# Patient Record
Sex: Male | Born: 1935 | Race: White | Hispanic: No | Marital: Married | Smoking: Never smoker
Health system: Southern US, Community
[De-identification: ages and names within clinical notes are randomized; demographics above are authoritative.]

## PROBLEM LIST (undated history)

## (undated) DIAGNOSIS — F0280 Dementia in other diseases classified elsewhere without behavioral disturbance: Secondary | ICD-10-CM

## (undated) DIAGNOSIS — K219 Gastro-esophageal reflux disease without esophagitis: Secondary | ICD-10-CM

## (undated) DIAGNOSIS — E785 Hyperlipidemia, unspecified: Secondary | ICD-10-CM

## (undated) DIAGNOSIS — L8994 Pressure ulcer of unspecified site, stage 4: Secondary | ICD-10-CM

## (undated) DIAGNOSIS — G309 Alzheimer's disease, unspecified: Secondary | ICD-10-CM

## (undated) DIAGNOSIS — F419 Anxiety disorder, unspecified: Secondary | ICD-10-CM

## (undated) DIAGNOSIS — R1310 Dysphagia, unspecified: Secondary | ICD-10-CM

## (undated) DIAGNOSIS — I469 Cardiac arrest, cause unspecified: Secondary | ICD-10-CM

## (undated) DIAGNOSIS — J9620 Acute and chronic respiratory failure, unspecified whether with hypoxia or hypercapnia: Secondary | ICD-10-CM

## (undated) DIAGNOSIS — N390 Urinary tract infection, site not specified: Secondary | ICD-10-CM

## (undated) DIAGNOSIS — E079 Disorder of thyroid, unspecified: Secondary | ICD-10-CM

## (undated) DIAGNOSIS — I10 Essential (primary) hypertension: Secondary | ICD-10-CM

## (undated) DIAGNOSIS — I4891 Unspecified atrial fibrillation: Secondary | ICD-10-CM

## (undated) HISTORY — PX: TRACHEOSTOMY TUBE PLACEMENT: SHX814

## (undated) HISTORY — PX: BACK SURGERY: SHX140

## (undated) HISTORY — PX: SHOULDER SURGERY: SHX246

## (undated) HISTORY — PX: GASTROSTOMY TUBE PLACEMENT: SHX655

## (undated) HISTORY — PX: KIDNEY STONE SURGERY: SHX686

---

## 2014-07-26 ENCOUNTER — Inpatient Hospital Stay (HOSPITAL_COMMUNITY)
Admission: EM | Admit: 2014-07-26 | Discharge: 2014-08-08 | DRG: 871 | Disposition: A | Discharge status: Skilled Nursing Facility | Payer: Medicare Other | Attending: Internal Medicine | Admitting: Internal Medicine

## 2014-07-26 ENCOUNTER — Encounter (HOSPITAL_COMMUNITY): Payer: Self-pay | Admitting: Vascular Surgery

## 2014-07-26 ENCOUNTER — Emergency Department (HOSPITAL_COMMUNITY): Payer: Medicare Other

## 2014-07-26 DIAGNOSIS — R739 Hyperglycemia, unspecified: Secondary | ICD-10-CM | POA: Diagnosis present

## 2014-07-26 DIAGNOSIS — I9589 Other hypotension: Secondary | ICD-10-CM | POA: Diagnosis present

## 2014-07-26 DIAGNOSIS — N189 Chronic kidney disease, unspecified: Secondary | ICD-10-CM

## 2014-07-26 DIAGNOSIS — Z931 Gastrostomy status: Secondary | ICD-10-CM

## 2014-07-26 DIAGNOSIS — F0390 Unspecified dementia without behavioral disturbance: Secondary | ICD-10-CM | POA: Diagnosis not present

## 2014-07-26 DIAGNOSIS — F0391 Unspecified dementia with behavioral disturbance: Secondary | ICD-10-CM

## 2014-07-26 DIAGNOSIS — J189 Pneumonia, unspecified organism: Secondary | ICD-10-CM | POA: Diagnosis present

## 2014-07-26 DIAGNOSIS — F0280 Dementia in other diseases classified elsewhere without behavioral disturbance: Secondary | ICD-10-CM | POA: Diagnosis present

## 2014-07-26 DIAGNOSIS — N179 Acute kidney failure, unspecified: Secondary | ICD-10-CM | POA: Diagnosis not present

## 2014-07-26 DIAGNOSIS — I429 Cardiomyopathy, unspecified: Secondary | ICD-10-CM | POA: Diagnosis not present

## 2014-07-26 DIAGNOSIS — I959 Hypotension, unspecified: Secondary | ICD-10-CM

## 2014-07-26 DIAGNOSIS — L89623 Pressure ulcer of left heel, stage 3: Secondary | ICD-10-CM | POA: Diagnosis not present

## 2014-07-26 DIAGNOSIS — Z9911 Dependence on respirator [ventilator] status: Secondary | ICD-10-CM

## 2014-07-26 DIAGNOSIS — D62 Acute posthemorrhagic anemia: Secondary | ICD-10-CM | POA: Diagnosis present

## 2014-07-26 DIAGNOSIS — E861 Hypovolemia: Secondary | ICD-10-CM | POA: Diagnosis not present

## 2014-07-26 DIAGNOSIS — G309 Alzheimer's disease, unspecified: Secondary | ICD-10-CM | POA: Diagnosis present

## 2014-07-26 DIAGNOSIS — E039 Hypothyroidism, unspecified: Secondary | ICD-10-CM | POA: Diagnosis not present

## 2014-07-26 DIAGNOSIS — D6489 Other specified anemias: Secondary | ICD-10-CM | POA: Diagnosis not present

## 2014-07-26 DIAGNOSIS — R64 Cachexia: Secondary | ICD-10-CM | POA: Diagnosis not present

## 2014-07-26 DIAGNOSIS — L89154 Pressure ulcer of sacral region, stage 4: Secondary | ICD-10-CM | POA: Diagnosis not present

## 2014-07-26 DIAGNOSIS — E870 Hyperosmolality and hypernatremia: Secondary | ICD-10-CM | POA: Diagnosis present

## 2014-07-26 DIAGNOSIS — R579 Shock, unspecified: Secondary | ICD-10-CM

## 2014-07-26 DIAGNOSIS — Y95 Nosocomial condition: Secondary | ICD-10-CM | POA: Diagnosis present

## 2014-07-26 DIAGNOSIS — R6520 Severe sepsis without septic shock: Secondary | ICD-10-CM | POA: Diagnosis not present

## 2014-07-26 DIAGNOSIS — Z515 Encounter for palliative care: Secondary | ICD-10-CM | POA: Diagnosis present

## 2014-07-26 DIAGNOSIS — Z8674 Personal history of sudden cardiac arrest: Secondary | ICD-10-CM | POA: Diagnosis not present

## 2014-07-26 DIAGNOSIS — K219 Gastro-esophageal reflux disease without esophagitis: Secondary | ICD-10-CM | POA: Diagnosis not present

## 2014-07-26 DIAGNOSIS — Z6820 Body mass index (BMI) 20.0-20.9, adult: Secondary | ICD-10-CM | POA: Diagnosis not present

## 2014-07-26 DIAGNOSIS — R6521 Severe sepsis with septic shock: Secondary | ICD-10-CM | POA: Diagnosis not present

## 2014-07-26 DIAGNOSIS — I480 Paroxysmal atrial fibrillation: Secondary | ICD-10-CM | POA: Diagnosis present

## 2014-07-26 DIAGNOSIS — Z930 Tracheostomy status: Secondary | ICD-10-CM | POA: Diagnosis not present

## 2014-07-26 DIAGNOSIS — D638 Anemia in other chronic diseases classified elsewhere: Secondary | ICD-10-CM | POA: Diagnosis not present

## 2014-07-26 DIAGNOSIS — J9610 Chronic respiratory failure, unspecified whether with hypoxia or hypercapnia: Secondary | ICD-10-CM | POA: Diagnosis present

## 2014-07-26 DIAGNOSIS — L8990 Pressure ulcer of unspecified site, unspecified stage: Secondary | ICD-10-CM | POA: Diagnosis present

## 2014-07-26 DIAGNOSIS — N390 Urinary tract infection, site not specified: Secondary | ICD-10-CM | POA: Diagnosis present

## 2014-07-26 DIAGNOSIS — E43 Unspecified severe protein-calorie malnutrition: Secondary | ICD-10-CM | POA: Diagnosis present

## 2014-07-26 DIAGNOSIS — D649 Anemia, unspecified: Secondary | ICD-10-CM | POA: Diagnosis present

## 2014-07-26 DIAGNOSIS — I129 Hypertensive chronic kidney disease with stage 1 through stage 4 chronic kidney disease, or unspecified chronic kidney disease: Secondary | ICD-10-CM | POA: Diagnosis not present

## 2014-07-26 DIAGNOSIS — B964 Proteus (mirabilis) (morganii) as the cause of diseases classified elsewhere: Secondary | ICD-10-CM | POA: Diagnosis present

## 2014-07-26 DIAGNOSIS — E038 Other specified hypothyroidism: Secondary | ICD-10-CM | POA: Diagnosis present

## 2014-07-26 DIAGNOSIS — I469 Cardiac arrest, cause unspecified: Secondary | ICD-10-CM | POA: Diagnosis present

## 2014-07-26 DIAGNOSIS — N10 Acute tubulo-interstitial nephritis: Secondary | ICD-10-CM | POA: Diagnosis not present

## 2014-07-26 DIAGNOSIS — E876 Hypokalemia: Secondary | ICD-10-CM | POA: Diagnosis not present

## 2014-07-26 DIAGNOSIS — F03C0 Unspecified dementia, severe, without behavioral disturbance, psychotic disturbance, mood disturbance, and anxiety: Secondary | ICD-10-CM | POA: Diagnosis present

## 2014-07-26 DIAGNOSIS — K5900 Constipation, unspecified: Secondary | ICD-10-CM | POA: Diagnosis present

## 2014-07-26 DIAGNOSIS — IMO0001 Reserved for inherently not codable concepts without codable children: Secondary | ICD-10-CM | POA: Diagnosis present

## 2014-07-26 DIAGNOSIS — A419 Sepsis, unspecified organism: Principal | ICD-10-CM

## 2014-07-26 DIAGNOSIS — K5909 Other constipation: Secondary | ICD-10-CM | POA: Diagnosis present

## 2014-07-26 HISTORY — DX: Cardiac arrest, cause unspecified: I46.9

## 2014-07-26 HISTORY — DX: Disorder of thyroid, unspecified: E07.9

## 2014-07-26 HISTORY — DX: Gastro-esophageal reflux disease without esophagitis: K21.9

## 2014-07-26 HISTORY — DX: Acute and chronic respiratory failure, unspecified whether with hypoxia or hypercapnia: J96.20

## 2014-07-26 HISTORY — DX: Urinary tract infection, site not specified: N39.0

## 2014-07-26 HISTORY — DX: Pressure ulcer of unspecified site, stage 4: L89.94

## 2014-07-26 HISTORY — DX: Dementia in other diseases classified elsewhere, unspecified severity, without behavioral disturbance, psychotic disturbance, mood disturbance, and anxiety: F02.80

## 2014-07-26 HISTORY — DX: Alzheimer's disease, unspecified: G30.9

## 2014-07-26 HISTORY — DX: Hyperlipidemia, unspecified: E78.5

## 2014-07-26 HISTORY — DX: Anxiety disorder, unspecified: F41.9

## 2014-07-26 HISTORY — DX: Unspecified atrial fibrillation: I48.91

## 2014-07-26 HISTORY — DX: Dysphagia, unspecified: R13.10

## 2014-07-26 HISTORY — DX: Essential (primary) hypertension: I10

## 2014-07-26 LAB — URINALYSIS, ROUTINE W REFLEX MICROSCOPIC
Bilirubin Urine: NEGATIVE
Glucose, UA: 500 mg/dL — AB
Ketones, ur: NEGATIVE mg/dL
NITRITE: NEGATIVE
PH: 8.5 — AB (ref 5.0–8.0)
Protein, ur: 100 mg/dL — AB
SPECIFIC GRAVITY, URINE: 1.02 (ref 1.005–1.030)
Urobilinogen, UA: 0.2 mg/dL (ref 0.0–1.0)

## 2014-07-26 LAB — CBC WITH DIFFERENTIAL/PLATELET
Basophils Absolute: 0 10*3/uL (ref 0.0–0.1)
Basophils Relative: 0 % (ref 0–1)
EOS ABS: 0.1 10*3/uL (ref 0.0–0.7)
Eosinophils Relative: 2 % (ref 0–5)
HCT: 16.9 % — ABNORMAL LOW (ref 39.0–52.0)
Hemoglobin: 4.8 g/dL — CL (ref 13.0–17.0)
Lymphocytes Relative: 14 % (ref 12–46)
Lymphs Abs: 0.9 10*3/uL (ref 0.7–4.0)
MCH: 31.2 pg (ref 26.0–34.0)
MCHC: 28.4 g/dL — AB (ref 30.0–36.0)
MCV: 109.7 fL — ABNORMAL HIGH (ref 78.0–100.0)
MONO ABS: 0.2 10*3/uL (ref 0.1–1.0)
Monocytes Relative: 3 % (ref 3–12)
NEUTROS PCT: 81 % — AB (ref 43–77)
Neutro Abs: 4.9 10*3/uL (ref 1.7–7.7)
Platelets: 56 10*3/uL — ABNORMAL LOW (ref 150–400)
RBC: 1.54 MIL/uL — ABNORMAL LOW (ref 4.22–5.81)
RDW: 21.4 % — AB (ref 11.5–15.5)
WBC MORPHOLOGY: INCREASED
WBC: 6.1 10*3/uL (ref 4.0–10.5)

## 2014-07-26 LAB — I-STAT ARTERIAL BLOOD GAS, ED
Acid-Base Excess: 1 mmol/L (ref 0.0–2.0)
Bicarbonate: 25.6 mEq/L — ABNORMAL HIGH (ref 20.0–24.0)
O2 Saturation: 52 %
PCO2 ART: 42.2 mmHg (ref 35.0–45.0)
PH ART: 7.391 (ref 7.350–7.450)
Patient temperature: 98.6
TCO2: 27 mmol/L (ref 0–100)
pO2, Arterial: 28 mmHg — CL (ref 80.0–100.0)

## 2014-07-26 LAB — COMPREHENSIVE METABOLIC PANEL
ALBUMIN: 1 g/dL — AB (ref 3.5–5.0)
ALT: 12 U/L — ABNORMAL LOW (ref 17–63)
AST: 13 U/L — AB (ref 15–41)
Alkaline Phosphatase: 129 U/L — ABNORMAL HIGH (ref 38–126)
Anion gap: 9 (ref 5–15)
BILIRUBIN TOTAL: 0.4 mg/dL (ref 0.3–1.2)
BUN: 130 mg/dL — AB (ref 6–20)
CALCIUM: 11.4 mg/dL — AB (ref 8.9–10.3)
CO2: 25 mmol/L (ref 22–32)
CREATININE: 1.8 mg/dL — AB (ref 0.61–1.24)
Chloride: 122 mmol/L — ABNORMAL HIGH (ref 101–111)
GFR calc Af Amer: 39 mL/min — ABNORMAL LOW (ref >60)
GFR calc non Af Amer: 34 mL/min — ABNORMAL LOW (ref >60)
Glucose, Bld: 428 mg/dL — ABNORMAL HIGH (ref 65–99)
Potassium: 5.1 mmol/L (ref 3.5–5.1)
Sodium: 156 mmol/L — ABNORMAL HIGH (ref 135–145)
Total Protein: 6.5 g/dL (ref 6.5–8.1)

## 2014-07-26 LAB — URINE MICROSCOPIC-ADD ON

## 2014-07-26 LAB — I-STAT CG4 LACTIC ACID, ED
LACTIC ACID, VENOUS: 4.5 mmol/L — AB (ref 0.5–2.0)
Lactic Acid, Venous: 3.99 mmol/L (ref 0.5–2.0)

## 2014-07-26 LAB — GLUCOSE, CAPILLARY: GLUCOSE-CAPILLARY: 218 mg/dL — AB (ref 65–99)

## 2014-07-26 LAB — PREPARE RBC (CROSSMATCH)

## 2014-07-26 LAB — ABO/RH: ABO/RH(D): O NEG

## 2014-07-26 LAB — CBG MONITORING, ED: Glucose-Capillary: 266 mg/dL — ABNORMAL HIGH (ref 65–99)

## 2014-07-26 LAB — MRSA PCR SCREENING: MRSA by PCR: NEGATIVE

## 2014-07-26 MED ORDER — LEVOTHYROXINE SODIUM 75 MCG PO TABS
75.0000 ug | ORAL_TABLET | Freq: Every day | ORAL | Status: DC
  Administered 2014-07-27 – 2014-08-08 (×13): 75 ug
  Filled 2014-07-26 (×15): qty 1

## 2014-07-26 MED ORDER — DEXTROSE 5 % IV SOLN
INTRAVENOUS | Status: DC
  Administered 2014-07-27: via INTRAVENOUS

## 2014-07-26 MED ORDER — FREE WATER
200.0000 mL | Status: DC
  Administered 2014-07-26 – 2014-08-02 (×41): 200 mL

## 2014-07-26 MED ORDER — CHLORHEXIDINE GLUCONATE 0.12 % MT SOLN
15.0000 mL | Freq: Two times a day (BID) | OROMUCOSAL | Status: DC
  Administered 2014-07-26 – 2014-08-08 (×26): 15 mL via OROMUCOSAL
  Filled 2014-07-26 (×28): qty 15

## 2014-07-26 MED ORDER — ONDANSETRON HCL 4 MG/2ML IJ SOLN: 4.0000 mg | Freq: Four times a day (QID) | INTRAMUSCULAR | Status: DC | PRN

## 2014-07-26 MED ORDER — VANCOMYCIN HCL IN DEXTROSE 1-5 GM/200ML-% IV SOLN: 1000.0000 mg | Freq: Once | INTRAVENOUS | Status: DC

## 2014-07-26 MED ORDER — VALPROIC ACID 250 MG/5ML PO SYRP
500.0000 mg | ORAL_SOLUTION | Freq: Three times a day (TID) | ORAL | Status: DC
  Administered 2014-07-26 – 2014-08-08 (×38): 500 mg
  Filled 2014-07-26 (×41): qty 10

## 2014-07-26 MED ORDER — VANCOMYCIN HCL 10 G IV SOLR
1250.0000 mg | Freq: Once | INTRAVENOUS | Status: AC
  Administered 2014-07-26: 1250 mg via INTRAVENOUS
  Filled 2014-07-26: qty 1250

## 2014-07-26 MED ORDER — CETYLPYRIDINIUM CHLORIDE 0.05 % MT LIQD
7.0000 mL | Freq: Four times a day (QID) | OROMUCOSAL | Status: DC
  Administered 2014-07-27 – 2014-08-08 (×50): 7 mL via OROMUCOSAL

## 2014-07-26 MED ORDER — VITAL HIGH PROTEIN PO LIQD
1000.0000 mL | ORAL | Status: DC
  Administered 2014-07-27: 1000 mL
  Filled 2014-07-26 (×2): qty 1000

## 2014-07-26 MED ORDER — FOLIC ACID 1 MG PO TABS
1.0000 mg | ORAL_TABLET | Freq: Every day | ORAL | Status: DC
  Administered 2014-07-27 – 2014-08-08 (×13): 1 mg
  Filled 2014-07-26 (×13): qty 1

## 2014-07-26 MED ORDER — SODIUM CHLORIDE 0.9 % IV BOLUS (SEPSIS)
500.0000 mL | INTRAVENOUS | Status: AC
  Administered 2014-07-26: 500 mL via INTRAVENOUS

## 2014-07-26 MED ORDER — AMIODARONE HCL 200 MG PO TABS
200.0000 mg | ORAL_TABLET | Freq: Every day | ORAL | Status: DC
  Administered 2014-07-27 – 2014-08-08 (×13): 200 mg
  Filled 2014-07-26 (×13): qty 1

## 2014-07-26 MED ORDER — PANTOPRAZOLE SODIUM 40 MG IV SOLR: 40.0000 mg | Freq: Every day | INTRAVENOUS | Status: DC

## 2014-07-26 MED ORDER — DEXTROSE 5 % IV SOLN
1.0000 g | Freq: Three times a day (TID) | INTRAVENOUS | Status: AC
  Administered 2014-07-27 – 2014-08-04 (×26): 1 g via INTRAVENOUS
  Filled 2014-07-26 (×29): qty 1

## 2014-07-26 MED ORDER — PANTOPRAZOLE SODIUM 40 MG PO PACK
40.0000 mg | PACK | Freq: Every day | ORAL | Status: DC
  Administered 2014-07-27 – 2014-08-07 (×12): 40 mg
  Filled 2014-07-26 (×13): qty 20

## 2014-07-26 MED ORDER — SODIUM CHLORIDE 0.9 % IV SOLN: 250.0000 mL | INTRAVENOUS | Status: DC | PRN

## 2014-07-26 MED ORDER — SODIUM CHLORIDE 0.9 % IV BOLUS (SEPSIS)
1000.0000 mL | INTRAVENOUS | Status: AC
  Administered 2014-07-26 (×2): 1000 mL via INTRAVENOUS

## 2014-07-26 MED ORDER — LEVOFLOXACIN IN D5W 750 MG/150ML IV SOLN
750.0000 mg | Freq: Once | INTRAVENOUS | Status: AC
  Administered 2014-07-26: 750 mg via INTRAVENOUS
  Filled 2014-07-26: qty 150

## 2014-07-26 MED ORDER — INSULIN GLARGINE 100 UNIT/ML ~~LOC~~ SOLN
15.0000 [IU] | Freq: Every day | SUBCUTANEOUS | Status: DC
  Administered 2014-07-26: 15 [IU] via SUBCUTANEOUS
  Filled 2014-07-26 (×3): qty 0.15

## 2014-07-26 MED ORDER — INSULIN ASPART 100 UNIT/ML ~~LOC~~ SOLN
10.0000 [IU] | Freq: Once | SUBCUTANEOUS | Status: AC
  Administered 2014-07-26: 10 [IU] via INTRAVENOUS
  Filled 2014-07-26: qty 1

## 2014-07-26 MED ORDER — SODIUM CHLORIDE 0.9 % IV SOLN
10.0000 mL/h | Freq: Once | INTRAVENOUS | Status: AC
  Administered 2014-07-26: 10 mL/h via INTRAVENOUS

## 2014-07-26 MED ORDER — FENTANYL CITRATE (PF) 100 MCG/2ML IJ SOLN
25.0000 ug | INTRAMUSCULAR | Status: DC | PRN
  Administered 2014-07-27 – 2014-07-29 (×2): 50 ug via INTRAVENOUS
  Filled 2014-07-26 (×2): qty 2

## 2014-07-26 MED ORDER — LEVOFLOXACIN IN D5W 750 MG/150ML IV SOLN
750.0000 mg | INTRAVENOUS | Status: DC
  Filled 2014-07-26: qty 150

## 2014-07-26 MED ORDER — ALTEPLASE 2 MG IJ SOLR
2.0000 mg | Freq: Once | INTRAMUSCULAR | Status: AC
  Administered 2014-07-26: 2 mg
  Filled 2014-07-26: qty 2

## 2014-07-26 MED ORDER — VANCOMYCIN HCL IN DEXTROSE 1-5 GM/200ML-% IV SOLN
1000.0000 mg | INTRAVENOUS | Status: DC
  Administered 2014-07-27: 1000 mg via INTRAVENOUS
  Filled 2014-07-26 (×2): qty 200

## 2014-07-26 MED ORDER — DEXTROSE 5 % IV SOLN
2.0000 g | Freq: Once | INTRAVENOUS | Status: AC
  Administered 2014-07-26: 2 g via INTRAVENOUS
  Filled 2014-07-26: qty 2

## 2014-07-26 MED ORDER — ACETAMINOPHEN 325 MG PO TABS: 650.0000 mg | ORAL_TABLET | ORAL | Status: DC | PRN

## 2014-07-26 MED ORDER — INSULIN ASPART 100 UNIT/ML ~~LOC~~ SOLN
0.0000 [IU] | SUBCUTANEOUS | Status: DC
  Administered 2014-07-26: 5 [IU] via SUBCUTANEOUS
  Administered 2014-07-26 – 2014-07-27 (×3): 3 [IU] via SUBCUTANEOUS
  Administered 2014-07-27: 5 [IU] via SUBCUTANEOUS
  Administered 2014-07-27: 2 [IU] via SUBCUTANEOUS
  Administered 2014-07-27: 11 [IU] via SUBCUTANEOUS
  Administered 2014-07-28: 3 [IU] via SUBCUTANEOUS
  Administered 2014-07-28 (×3): 5 [IU] via SUBCUTANEOUS
  Administered 2014-07-28: 8 [IU] via SUBCUTANEOUS
  Administered 2014-07-29 (×2): 3 [IU] via SUBCUTANEOUS
  Administered 2014-07-29: 5 [IU] via SUBCUTANEOUS
  Administered 2014-07-29: 2 [IU] via SUBCUTANEOUS
  Administered 2014-07-29: 5 [IU] via SUBCUTANEOUS
  Administered 2014-07-29 – 2014-07-31 (×9): 3 [IU] via SUBCUTANEOUS
  Administered 2014-07-31: 2 [IU] via SUBCUTANEOUS
  Administered 2014-08-01 – 2014-08-03 (×6): 0 [IU] via SUBCUTANEOUS

## 2014-07-26 NOTE — ED Notes (Signed)
X-ray at bedside

## 2014-07-26 NOTE — Progress Notes (Addendum)
ANTIBIOTIC CONSULT NOTE - INITIAL  Pharmacy Consult for vanc/levo/aztreonam Indication: rule out sepsis  Allergies  Allergen Reactions  . Other     "Benzathine"  . Rocephin [Ceftriaxone Sodium In Dextrose]     Patient Measurements: Height: 6\' 1"  (185.4 cm) IBW/kg (Calculated) : 79.9  Vital Signs: BP: 82/50 mmHg (06/22 1641) Pulse Rate: 77 (06/22 1641) Intake/Output from previous day:   Intake/Output from this shift:    Labs: No results for input(s): WBC, HGB, PLT, LABCREA, CREATININE in the last 72 hours. CrCl cannot be calculated (Unknown ideal weight.). No results for input(s): VANCOTROUGH, VANCOPEAK, VANCORANDOM, GENTTROUGH, GENTPEAK, GENTRANDOM, TOBRATROUGH, TOBRAPEAK, TOBRARND, AMIKACINPEAK, AMIKACINTROU, AMIKACIN in the last 72 hours.   Microbiology: No results found for this or any previous visit (from the past 720 hour(s)).  Medical History: Past Medical History  Diagnosis Date  . Alzheimer disease   . Anxiety   . Acute and chronic respiratory failure (acute-on-chronic)   . Cardiac arrest   . Urinary tract infection   . Hypertension   . Hyperlipidemia   . Atrial fibrillation   . Dysphagia   . GERD (gastroesophageal reflux disease)   . Thyroid disease   . Stage 4 pressure ulcer sacrum   Assessment: 83 yom chronic trach-vent dependent fro [facility]. Received several units of blood on admit. Pharmacy consulted to dose vanc/aztreonam/levo for sepsis.CrCl 31.4 ml/Min, lactate 4.5, AF  6/22 vanc>> 6/22 levo>> 6/22 aztreonam  Goal of Therapy:  Vancomycin trough level 15-20 mcg/ml  Plan:  Vanc 1250mg  IV x1, then 1gm q24h Aztreonam 2g x1, then 1 g q8h Levo 750mg  q48h Mon clinical progress, c/s, renal function, abx plan  F/u labs - abx maintenance doses  F/u need to double cover going forward?  Isaac Bliss, PharmD, BCPS Clinical Pharmacist Pager [phone] 07/26/2014 8:32 PM

## 2014-07-26 NOTE — ED Notes (Signed)
Pt reports to the ED for eval of low Hb. He had his blood drawn and his HB was noted to be 4.8 mg/dl. His other labs were also noted to be abnormal of note his Na+ was 152 mg/dl, platelets were 47, CBG 418 mg/dl, BUN - 161, Ca+ 09.6, and a Mag of 3.1. Pt received his first unit of blood PTA and tolerated it well. He is a trach pt. Also reported is possible VAP with rhonchi noted by MD at facility. PICC line present to right upper arm. Stage 4 pressure ulcer reported on sacral area. Pt alert and non-verbal at baseline. Resp elevated. Skin warm and dry. BP low at 81/36. Pt full code.

## 2014-07-26 NOTE — ED Notes (Signed)
NOTIFIED DR. S.ZACKOWSKI FOR PATIENTS LAB RESULTS OF CG4+LACTIC ACID @17 :08PM ,07/26/2014.

## 2014-07-26 NOTE — ED Provider Notes (Addendum)
CSN: [id number]     Arrival date & time 07/26/14  1523 History   First MD Initiated Contact with Patient 07/26/14 1544     Chief Complaint  Patient presents with  . Abnormal Lab     (Consider location/radiation/quality/duration/timing/severity/associated sxs/prior Treatment) The history is provided by the nursing home and a relative. The history is limited by the condition of the patient.   81 year old male from [facility]. Patient is vent dependent also has severe Alzheimer's disease. Patient is a full code. Patient sent to the ED for eval of low hemoglobin. Patient had a hemoglobin therefore 0.8. Other labs showed abnormalities. Sodium was elevated. Blood sugar elevated also BUN elevated creatinine of 2 terribly elevated. Calcium was also elevated 11.5. According to nursing note patient received first unit of blood prior to arrival. Not sure if this is accurate. We'll try to verify with family. Level V caveat applies to the history and patient has severe Alzheimer's disease.  Patient has a triple-lumen in the left arm area. Foley catheter in place.  Past Medical History  Diagnosis Date  . Alzheimer disease   . Anxiety   . Acute and chronic respiratory failure (acute-on-chronic)   . Cardiac arrest   . Urinary tract infection   . Hypertension   . Hyperlipidemia   . Atrial fibrillation   . Dysphagia   . GERD (gastroesophageal reflux disease)   . Thyroid disease   . Stage 4 pressure ulcer sacrum   Past Surgical History  Procedure Laterality Date  . Gastrostomy tube placement    . Back surgery    . Kidney stone surgery    . Shoulder surgery    . Tracheostomy tube placement     No family history on file. History  Substance Use Topics  . Smoking status: Never Smoker   . Smokeless tobacco: Never Used  . Alcohol Use: No    Review of Systems  Unable to perform ROS level V caveat applies due to patient's severe Alzheimer disease. Patient also vent dependent.    Allergies   Other and Rocephin  Home Medications   Prior to Admission medications   Medication Sig Start Date End Date Taking? Authorizing Provider  acetaminophen (TYLENOL) 325 MG tablet 650 mg by PEG Tube route every 4 (four) hours as needed for mild pain or moderate pain.   Yes Historical Provider, MD  amiodarone (PACERONE) 200 MG tablet 200 mg by PEG Tube route daily.   Yes Historical Provider, MD  bacitracin ointment Apply 1 application topically 2 (two) times daily.   Yes Historical Provider, MD  chlorhexidine (PERIDEX) 0.12 % solution Use as directed 15 mLs in the mouth or throat 2 (two) times daily.   Yes Historical Provider, MD  cloNIDine (CATAPRES) 0.1 MG tablet 0.1 mg by PEG Tube route every 8 (eight) hours.   Yes Historical Provider, MD  dextrose 5 % solution Inject 75 mLs into the vein daily. 13mL/hr for hydration   Yes Historical Provider, MD  diltiazem (CARDIZEM) 60 MG tablet Take 120 mg by mouth every 8 (eight) hours.   Yes Historical Provider, MD  docusate (COLACE) 50 MG/5ML liquid Place 200 mg into feeding tube daily.   Yes Historical Provider, MD  HYDROcodone-acetaminophen (NORCO/VICODIN) 5-325 MG per tablet 1 tablet by PEG Tube route every 6 (six) hours as needed for moderate pain.   Yes Historical Provider, MD  ipratropium-albuterol (DUONEB) 0.5-2.5 (3) MG/3ML SOLN Take 3 mLs by nebulization every 6 (six) hours as needed.  Yes Historical Provider, MD  levothyroxine (SYNTHROID, LEVOTHROID) 75 MCG tablet 75 mcg by PEG Tube route daily before breakfast.   Yes Historical Provider, MD  LORazepam (ATIVAN) 2 MG/ML concentrated solution Take 2 mg by mouth every 8 (eight) hours as needed for anxiety (Shortness of Breath).   Yes Historical Provider, MD  omeprazole (PRILOSEC) 40 MG capsule 40 mg by PEG Tube route daily.   Yes Historical Provider, MD  ondansetron (ZOFRAN) 4 MG tablet 4 mg by PEG Tube route every 6 (six) hours as needed for nausea or vomiting.   Yes Historical Provider, MD   Pediatric Multiple Vitamins (MULTIVITAMIN) LIQD Place 5 mLs into feeding tube daily.   Yes Historical Provider, MD  sennosides-docusate sodium (SENOKOT-S) 8.6-50 MG tablet 1 tablet by PEG Tube route daily.   Yes Historical Provider, MD  Valproic Acid (DEPAKENE) 250 MG/5ML SYRP syrup Place 500 mg into feeding tube every 8 (eight) hours.   Yes Historical Provider, MD  ciprofloxacin (CIPRO) 400 MG/40ML SOLN injection Inject 400 mg into the vein every 12 (twelve) hours.    Historical Provider, MD   BP 93/41 mmHg  Pulse 72  Temp(Src) 98.4 F (36.9 C) (Rectal)  Resp 30  Ht 6\' 1"  (1.854 m)  Wt 152 lb (68.947 kg)  BMI 20.06 kg/m2  SpO2 100% Physical Exam  Constitutional: He appears well-developed and well-nourished.  HENT:  Head: Normocephalic and atraumatic.  Patient with trach and is vent dependent.  Neck: Neck supple.  Tracheostomy.  Cardiovascular: Normal rate and regular rhythm.   Pulmonary/Chest: He has rales.  Abdominal: Soft. Bowel sounds are normal. He exhibits no distension.  G-tube in place.  Musculoskeletal: He exhibits no tenderness.  Neurological:  Nonverbal.  Skin: Skin is warm.  Nursing note and vitals reviewed.   ED Course  Procedures (including critical care time) Labs Review Labs Reviewed  COMPREHENSIVE METABOLIC PANEL - Abnormal; Notable for the following:    Sodium 156 (*)    Chloride 122 (*)    Glucose, Bld 428 (*)    BUN 130 (*)    Creatinine, Ser 1.80 (*)    Calcium 11.4 (*)    Albumin 1.0 (*)    AST 13 (*)    ALT 12 (*)    Alkaline Phosphatase 129 (*)    GFR calc non Af Amer 34 (*)    GFR calc Af Amer 39 (*)    All other components within normal limits  CBC WITH DIFFERENTIAL/PLATELET - Abnormal; Notable for the following:    RBC 1.54 (*)    Hemoglobin 4.8 (*)    HCT 16.9 (*)    MCV 109.7 (*)    MCHC 28.4 (*)    RDW 21.4 (*)    Platelets 56 (*)    Neutrophils Relative % 81 (*)    All other components within normal limits  URINALYSIS,  ROUTINE W REFLEX MICROSCOPIC (NOT AT [facility]) - Abnormal; Notable for the following:    APPearance HAZY (*)    pH 8.5 (*)    Glucose, UA 500 (*)    Hgb urine dipstick LARGE (*)    Protein, ur 100 (*)    Leukocytes, UA LARGE (*)    All other components within normal limits  URINE MICROSCOPIC-ADD ON - Abnormal; Notable for the following:    Bacteria, UA FEW (*)    Casts GRANULAR CAST (*)    Crystals TRIPLE PHOSPHATE CRYSTALS (*)    All other components within normal limits  I-STAT CG4 LACTIC  ACID, ED - Abnormal; Notable for the following:    Lactic Acid, Venous 3.99 (*)    All other components within normal limits  I-STAT ARTERIAL BLOOD GAS, ED - Abnormal; Notable for the following:    pO2, Arterial 28.0 (*)    Bicarbonate 25.6 (*)    All other components within normal limits  CULTURE, BLOOD (ROUTINE X 2)  CULTURE, BLOOD (ROUTINE X 2)  URINE CULTURE  I-STAT CG4 LACTIC ACID, ED  PREPARE RBC (CROSSMATCH)  TYPE AND SCREEN  ABO/RH   Results for orders placed or performed during the hospital encounter of 07/26/14  Comprehensive metabolic panel  Result Value Ref Range   Sodium 156 (H) 135 - 145 mmol/L   Potassium 5.1 3.5 - 5.1 mmol/L   Chloride 122 (H) 101 - 111 mmol/L   CO2 25 22 - 32 mmol/L   Glucose, Bld 428 (H) 65 - 99 mg/dL   BUN 161 (H) 6 - 20 mg/dL   Creatinine, Ser 0.96 (H) 0.61 - 1.24 mg/dL   Calcium 04.5 (H) 8.9 - 10.3 mg/dL   Total Protein 6.5 6.5 - 8.1 g/dL   Albumin 1.0 (L) 3.5 - 5.0 g/dL   AST 13 (L) 15 - 41 U/L   ALT 12 (L) 17 - 63 U/L   Alkaline Phosphatase 129 (H) 38 - 126 U/L   Total Bilirubin 0.4 0.3 - 1.2 mg/dL   GFR calc non Af Amer 34 (L) >60 mL/min   GFR calc Af Amer 39 (L) >60 mL/min   Anion gap 9 5 - 15  CBC with Differential  Result Value Ref Range   WBC 6.1 4.0 - 10.5 K/uL   RBC 1.54 (L) 4.22 - 5.81 MIL/uL   Hemoglobin 4.8 (LL) 13.0 - 17.0 g/dL   HCT 40.9 (L) 81.1 - 91.4 %   MCV 109.7 (H) 78.0 - 100.0 fL   MCH 31.2 26.0 - 34.0 pg   MCHC 28.4 (L)  30.0 - 36.0 g/dL   RDW 78.2 (H) 95.6 - 21.3 %   Platelets 56 (L) 150 - 400 K/uL   Neutrophils Relative % 81 (H) 43 - 77 %   Lymphocytes Relative 14 12 - 46 %   Monocytes Relative 3 3 - 12 %   Eosinophils Relative 2 0 - 5 %   Basophils Relative 0 0 - 1 %   Neutro Abs 4.9 1.7 - 7.7 K/uL   Lymphs Abs 0.9 0.7 - 4.0 K/uL   Monocytes Absolute 0.2 0.1 - 1.0 K/uL   Eosinophils Absolute 0.1 0.0 - 0.7 K/uL   Basophils Absolute 0.0 0.0 - 0.1 K/uL   WBC Morphology INCREASED BANDS (>20% BANDS)   Urinalysis, Routine w reflex microscopic (not at [facility])  Result Value Ref Range   Color, Urine YELLOW YELLOW   APPearance HAZY (A) CLEAR   Specific Gravity, Urine 1.020 1.005 - 1.030   pH 8.5 (H) 5.0 - 8.0   Glucose, UA 500 (A) NEGATIVE mg/dL   Hgb urine dipstick LARGE (A) NEGATIVE   Bilirubin Urine NEGATIVE NEGATIVE   Ketones, ur NEGATIVE NEGATIVE mg/dL   Protein, ur 086 (A) NEGATIVE mg/dL   Urobilinogen, UA 0.2 0.0 - 1.0 mg/dL   Nitrite NEGATIVE NEGATIVE   Leukocytes, UA LARGE (A) NEGATIVE  Urine microscopic-add on  Result Value Ref Range   Squamous Epithelial / LPF RARE RARE   WBC, UA TOO NUMEROUS TO COUNT <3 WBC/hpf   RBC / HPF TOO NUMEROUS TO COUNT <3 RBC/hpf  Bacteria, UA FEW (A) RARE   Casts GRANULAR CAST (A) NEGATIVE   Crystals TRIPLE PHOSPHATE CRYSTALS (A) NEGATIVE   Urine-Other MUCOUS PRESENT   I-Stat CG4 Lactic Acid, ED (Not at [facility] or [facility])  Result Value Ref Range   Lactic Acid, Venous 3.99 (HH) 0.5 - 2.0 mmol/L   Comment NOTIFIED PHYSICIAN   I-Stat Arterial Blood Gas, ED - (order at [facility] and [facility] only)  Result Value Ref Range   pH, Arterial 7.391 7.350 - 7.450   pCO2 arterial 42.2 35.0 - 45.0 mmHg   pO2, Arterial 28.0 (LL) 80.0 - 100.0 mmHg   Bicarbonate 25.6 (H) 20.0 - 24.0 mEq/L   TCO2 27 0 - 100 mmol/L   O2 Saturation 52.0 %   Acid-Base Excess 1.0 0.0 - 2.0 mmol/L   Patient temperature 98.6 F    Collection site RADIAL, ALLEN'S TEST ACCEPTABLE    Drawn by Operator    Sample  type ARTERIAL    Comment MD NOTIFIED, REPEAT TEST   Prepare RBC  Result Value Ref Range   Order Confirmation ORDER PROCESSED BY BLOOD BANK   Type and screen  Result Value Ref Range   ABO/RH(D) O NEG    Antibody Screen NEG    Sample Expiration 07/29/2014    Unit Number [id number]    Blood Component Type RBC LR PHER1    Unit division 00    Status of Unit ALLOCATED    Transfusion Status OK TO TRANSFUSE    Crossmatch Result Compatible    Unit Number [id number]    Blood Component Type RBC LR PHER2    Unit division 00    Status of Unit ALLOCATED    Transfusion Status OK TO TRANSFUSE    Crossmatch Result Compatible   ABO/Rh  Result Value Ref Range   ABO/RH(D) O NEG      Imaging Review Dg Chest Portable 1 View  07/26/2014   CLINICAL DATA:  Tracheostomy tube patient. Acute decreasing hemoglobin. Atrial fibrillation. Hypertension. Acute on chronic respiratory failure. Coronary artery disease.  EXAM: PORTABLE CHEST - 1 VIEW  COMPARISON:  None.  FINDINGS: Tracheostomy tube is seen in appropriate position as well as transvenous pacemaker. Prior CABG noted.  Heart size is within normal limits. Diffuse pulmonary interstitial prominence is seen, suspicious for mild interstitial edema, although this could also be chronic in etiology. Opacity in the left retrocardiac lung base may be due to atelectasis or consolidation.  Asymmetric right hilar soft tissue fullness noted, and right hilar lymphadenopathy cannot be excluded. Several old right rib fracture deformities incidentally noted.  IMPRESSION: Left retrocardiac atelectasis versus consolidation.  Diffuse pulmonary interstitial prominence suspicious for mild interstitial edema, although this could also be chronic in etiology.  Right hilar soft tissue fullness. Right hilar mass or lymphadenopathy cannot be excluded. Recommend chest CT with contrast for further evaluation.   Electronically Signed   By: Myles Rosenthal M.D.   On: 07/26/2014 16:21      EKG Interpretation   Date/Time:  Wednesday July 26 2014 16:40:45 EDT Ventricular Rate:  72 PR Interval:    QRS Duration: 85 QT Interval:  383 QTC Calculation: 419 R Axis:   41 Text Interpretation:  Accelerated junctional rhythm No previous ECGs  available Confirmed by Jaimon Bugaj  MD, Eliazar Olivar [phone]) on 07/26/2014 4:51:09  PM       CRITICAL CARE Performed by: Vanetta Mulders Total critical care time: 30 Critical care time was exclusive of separately billable procedures and treating other patients. Critical care was  necessary to treat or prevent imminent or life-threatening deterioration. Critical care was time spent personally by me on the following activities: development of treatment plan with patient and/or surrogate as well as nursing, discussions with consultants, evaluation of patient's response to treatment, examination of patient, obtaining history from patient or surrogate, ordering and performing treatments and interventions, ordering and review of laboratory studies, ordering and review of radiographic studies, pulse oximetry and re-evaluation of patient's condition.     MDM   Final diagnoses:  Anemia, unspecified anemia type  HCAP (healthcare-associated pneumonia)  Hypotension, unspecified hypotension type  Sepsis, due to unspecified organism  Hyperglycemia    After blood gas done is most likely venous. Does show the pH is reasonable and that PCO2 is reasonable. Patient slightly acids elevated. Patient has multiple possible sources for infection to include decubiti type wounds history of aspiration pneumonia. As well as patient's urinalysis here is consistent with urinary tract infection.  Patient arrived hypotensive with blood pressure systolic around 81. Patient had sepsis orders placed fluid resuscitation began broad-spectrum anabiotic begun. Discussed with pulmonary critical care they will see the patient. Patient also with significant anemia we'll require  transfusion. Blood ordered.  Patient is a full code. Despite the fact that has severe Alzheimer's and is vent dependent. These are family wishes. Well documented in the patient's record from [facility].    Patient blood pressure improving some with fluids. Systolic blood pressure now up to 93. Patient's blood sugar also markedly elevated will be given some IV of regular insulin.   Vanetta Mulders, MD 07/26/14 1756  Vanetta Mulders, MD 07/26/14 5027  Vanetta Mulders, MD 07/26/14 1807  Vanetta Mulders, MD 08/11/14 [phone]

## 2014-07-26 NOTE — Progress Notes (Signed)
Notified E-link doctor of patient's BP 83/42 (MAP 51).  Order received for 500 cc bolus.  Infusing now.  Will continue to monitor and assess.

## 2014-07-26 NOTE — H&P (Signed)
PULMONARY / CRITICAL CARE MEDICINE   Name: Jason Becker MRN: [id number] DOB: [birth year]    ADMISSION DATE:  07/26/2014   Becker PROFILE:  11 M [facility] Becker with very advanced dementia who has been 100% vent dependent for approx one year with a complicated course over that time including at least one cardiac arrest, decubitus pressure ulcers, infections with resistant organisms (UTI, PNA), recurrent anemia. He was initially intubated for aspiration PAN and underwent trach tube placement for recurrence. Prior to his initial hospitalization one year ago, his dementia was advanced to the point where he required assistance with feeding and his daughter indicates that his intellect/functional status was equivalent that of a [age] yr old. Routine weekly labs were obtained on the day of admission with Hgb 4.8 and Cr 1.8 prompting transfer to [facility] ED where he was initially treated as a code sepsis. A unit of PRBCs was transfused en route to the ED and two more units ordered in the ED  MAJOR EVENTS/TEST RESULTS: 6/22 Admission to [facility] as above  INDWELLING DEVICES:: Trach (chronic) G tube (chronic) LUE PICC (from [facility] - insertion date unknown)  MICRO DATA: Urine 6/22 >>  Resp 6/22 >>  Blood 6/22 >>    ANTIMICROBIALS:  Vanc 6/22 >>  Aztreonam 6/22 >>    HISTORY OF PRESENT ILLNESS:   Jason Becker with very advanced dementia who has been 100% vent dependent for approx one year with a complicated course over that time including decubitus pressure ulcers, infections with resistant organisms (UTI, PNA), recurrent anemia. Routine wekly labs were obtained on the day of admission with Hgb 4.8 and Cr 1.8 prompting transfer to [facility] ED where he was initially treated as a code sepsis. A unit of PRBCs was transfused en route to the ED and two more units ordered in the ED. Becker is unable to provide any history. His wife and daughter are at the bedside but cannot provide details other than what is  documented above. His records from [facility] have been reviewed in detail  PAST MEDICAL HISTORY :   has a past medical history of Alzheimer disease; Anxiety; Acute and chronic respiratory failure (acute-on-chronic); Cardiac arrest; Urinary tract infection; Hypertension; Hyperlipidemia; Atrial fibrillation; Dysphagia; GERD (gastroesophageal reflux disease); Thyroid disease; and Stage 4 pressure ulcer (sacrum).  has past surgical history that includes Gastrostomy tube placement; Back surgery; Kidney stone surgery; Shoulder surgery; and Tracheostomy tube placement. Prior to Admission medications   Medication Sig Start Date End Date Taking? Authorizing Provider  acetaminophen (TYLENOL) 325 MG tablet 650 mg by PEG Tube route every 4 (four) hours as needed for mild pain or moderate pain.   Yes Historical Provider, MD  amiodarone (PACERONE) 200 MG tablet 200 mg by PEG Tube route daily.   Yes Historical Provider, MD  bacitracin ointment Apply 1 application topically 2 (two) times daily.   Yes Historical Provider, MD  chlorhexidine (PERIDEX) 0.12 % solution Use as directed 15 mLs in the mouth or throat 2 (two) times daily.   Yes Historical Provider, MD  cloNIDine (CATAPRES) 0.1 MG tablet 0.1 mg by PEG Tube route every 8 (eight) hours.   Yes Historical Provider, MD  dextrose 5 % solution Inject 75 mLs into the vein daily. 15mL/hr for hydration   Yes Historical Provider, MD  diltiazem (CARDIZEM) 60 MG tablet Take 120 mg by mouth every 8 (eight) hours.   Yes Historical Provider, MD  docusate (COLACE) 50 MG/5ML liquid Place 200 mg into feeding tube  daily.   Yes Historical Provider, MD  HYDROcodone-acetaminophen (NORCO/VICODIN) 5-325 MG per tablet 1 tablet by PEG Tube route every 6 (six) hours as needed for moderate pain.   Yes Historical Provider, MD  ipratropium-albuterol (DUONEB) 0.5-2.5 (3) MG/3ML SOLN Take 3 mLs by nebulization every 6 (six) hours as needed.   Yes Historical Provider, MD  levothyroxine  (SYNTHROID, LEVOTHROID) 75 MCG tablet 75 mcg by PEG Tube route daily before breakfast.   Yes Historical Provider, MD  LORazepam (ATIVAN) 2 MG/ML concentrated solution Take 2 mg by mouth every 8 (eight) hours as needed for anxiety (Shortness of Breath).   Yes Historical Provider, MD  omeprazole (PRILOSEC) 40 MG capsule 40 mg by PEG Tube route daily.   Yes Historical Provider, MD  ondansetron (ZOFRAN) 4 MG tablet 4 mg by PEG Tube route every 6 (six) hours as needed for nausea or vomiting.   Yes Historical Provider, MD  Pediatric Multiple Vitamins (MULTIVITAMIN) LIQD Place 5 mLs into feeding tube daily.   Yes Historical Provider, MD  sennosides-docusate sodium (SENOKOT-S) 8.6-50 MG tablet 1 tablet by PEG Tube route daily.   Yes Historical Provider, MD  Valproic Acid (DEPAKENE) 250 MG/5ML SYRP syrup Place 500 mg into feeding tube every 8 (eight) hours.   Yes Historical Provider, MD  ciprofloxacin (CIPRO) 400 MG/40ML SOLN injection Inject 400 mg into the vein every 12 (twelve) hours.    Historical Provider, MD   Allergies  Allergen Reactions  . Other     "Benzathine"  . Rocephin [Ceftriaxone Sodium In Dextrose]     FAMILY HISTORY:  has no family status information on file.  SOCIAL HISTORY:  reports that he has never smoked. He has never used smokeless tobacco. He reports that he does not drink alcohol or use illicit drugs.  REVIEW OF SYSTEMS:  N/A  SUBJECTIVE:   VITAL SIGNS: Temp:  [97.4 F (36.3 C)-98.4 F (36.9 C)] 97.5 F (36.4 C) (06/22 1945) Pulse Rate:  [51-80] 64 (06/22 1945) Resp:  [18-39] 23 (06/22 2000) BP: (75-93)/(35-50) 91/47 mmHg (06/22 2000) SpO2:  [70 %-100 %] 98 % (06/22 1945) FiO2 (%):  [28 %-30 %] 30 % (06/22 1930) Weight:  [68.947 kg (152 lb)] 68.947 kg (152 lb) (06/22 1641) HEMODYNAMICS:   VENTILATOR SETTINGS: Vent Mode:  [-] PRVC FiO2 (%):  [28 %-30 %] 30 % Set Rate:  [20 bmp-30 bmp] 20 bmp Vt Set:  [500 mL] 500 mL PEEP:  [5 cmH20] 5 cmH20 Plateau  Pressure:  [19 cmH20-20 cmH20] 19 cmH20 INTAKE / OUTPUT: No intake or output data in the 24 hours ending 07/26/14 2014  PHYSICAL EXAMINATION: General: Cachectic, staring blankly, occasionally grimacing, not F/C, no spont movement of extremities Neuro: EOMI, PERRL, no spont movement, grimaces to painful stimuli HEENT: temporal wasting, poor dentition Cardiovascular:  freq extrasystoles, no M noted Lungs: tachypneic, no wheezes, dependent crackles Abdomen: scaphoid, diminished BS, no overt tenderness, G tube present Ext: severe muscle wasting, no LE edema, LUE PICC Skin: Large sacral ulcer, bilateral heel ulcers ulcer on L calf  LABS:  CBC  Recent Labs Lab 07/26/14 1637  WBC 6.1  HGB 4.8*  HCT 16.9*  PLT 56*   Coag's No results for input(s): APTT, INR in the last 168 hours. BMET  Recent Labs Lab 07/26/14 1637  NA 156*  K 5.1  CL 122*  CO2 25  BUN 130*  CREATININE 1.80*  GLUCOSE 428*   Electrolytes  Recent Labs Lab 07/26/14 1637  CALCIUM 11.4*   Sepsis  Markers  Recent Labs Lab 07/26/14 1657 07/26/14 1912  LATICACIDVEN 3.99* 4.50*   ABG  Recent Labs Lab 07/26/14 1736  PHART 7.391  PCO2ART 42.2  PO2ART 28.0*   Liver Enzymes  Recent Labs Lab 07/26/14 1637  AST Jason*  ALT 12*  ALKPHOS 129*  BILITOT 0.4  ALBUMIN 1.0*   Cardiac Enzymes No results for input(s): TROPONINI, PROBNP in the last 168 hours. Glucose  Recent Labs Lab 07/26/14 1947  GLUCAP 266*    CXR: LLL Atx vs infiltrate, IS prominence    ASSESSMENT / PLAN:  PULMONARY A: Chronic VDRF - at this point there is no realistic hope for liberation from the vent P:   Full vent support - settings reviewed and adjusted Vent bundle   CARDIOVASCULAR A:  H/O cardiac arrest AICD present H/O hypertension PAF Suspect cardiomyopathy/CHF Poor candidate for vasopressors Very poor candidate for ACLS P:  Cont amiodarone Holding clonidine and diltiazem MAP goal > 60  mmHg Consider vasopressors if needed  RENAL A:   AKI, likely due to hypotension CKD Severe hypernatremia, free water deficit Not a candidate for HD P:   Monitor BMET intermittently Monitor I/Os Correct electrolytes as indicated D5W ordered - correct hypernatremia slowly  GASTROINTESTINAL A:   Chronic G tube Chronic PPI use P:   SUP: enteral pantoprazole TF per protocol ordered  HEMATOLOGIC A:  Chronic anemia Severe acute anemia - suspect acute blood loss P:  DVT px: SCDs  Monitor CBC intermittently Transfuse per usual guidelines  Goal Hgb > 7.0 gm/dL  INFECTIOUS A:   Possible severe sepsis Multiple decubitus ulcers Possible LLL PNA H/O MRSA, pseudomonas, ESBL E coli P:   PCT algorithm WOC consultation requested Micro and abx as above  ENDOCRINE A:   Severe hyperglycemia without prior documentation of DM   Hypothyroidism P:   Lantus 10 units daily SSI protocol Cont L thyroxine @ 75 mcg daily Check TSH AM 6/23  NEUROLOGIC A:  Very advanced dementia Very severe, irreversible debilitation Chronic pain due to bedbound/ICU status and decubitus ulcers P:   RASS goal: 0,-1 PRN fentanyl   FAMILY/GOALS OF CARE: 6/22 Dr Sung Amabile: I spoke with Becker's wife and daughter in person and son over phone. I stated unequivocally that he will not recover in a way that would be regarded as a favorable outcome. I clarified that there are potential interventions now that are no longer reasonable options and will not be offered. These would include major surgeries, hemodialysis, etc. His wife acknowledged that he is suffering and I implored that we readjust our priorities to put his comfort and dignity highest on the list. There have been, according to records sent from [facility], numerous discussions regarding advanced directives in the past and the son, Luisa Hart, has repeatedly insisted on "do everything". Luisa Hart will be returning from [facility] 6/23 and asked him to be prepared to  address this topic again   CCM time: 60 mins including 30 mins of consultation with wife and daughter addressing goals of care   Billy Fischer, MD ; [facility] service Mobile [phone].  After 5:30 PM or weekends, call [phone] Pulmonary and Critical Care Medicine [facility] Pager: [phone]  07/26/2014, 8:14 PM

## 2014-07-26 NOTE — Progress Notes (Signed)
Patient is a chronic trach-vent dependent from [facility] with the above vent settings per OSH, patient has a XLT distal #8 Shiley trach. SATS 97%, HR 79 bpm, BP 85/40, RR 37 BPM, will continue to monitor patient.

## 2014-07-27 ENCOUNTER — Inpatient Hospital Stay (HOSPITAL_COMMUNITY): Payer: Medicare Other

## 2014-07-27 DIAGNOSIS — I959 Hypotension, unspecified: Secondary | ICD-10-CM

## 2014-07-27 DIAGNOSIS — IMO0001 Reserved for inherently not codable concepts without codable children: Secondary | ICD-10-CM | POA: Diagnosis present

## 2014-07-27 DIAGNOSIS — J189 Pneumonia, unspecified organism: Secondary | ICD-10-CM

## 2014-07-27 DIAGNOSIS — R739 Hyperglycemia, unspecified: Secondary | ICD-10-CM

## 2014-07-27 DIAGNOSIS — E43 Unspecified severe protein-calorie malnutrition: Secondary | ICD-10-CM

## 2014-07-27 DIAGNOSIS — L8990 Pressure ulcer of unspecified site, unspecified stage: Secondary | ICD-10-CM

## 2014-07-27 LAB — GLUCOSE, RANDOM
Glucose, Bld: 562 mg/dL (ref 65–99)
Glucose, Bld: 231 mg/dL — ABNORMAL HIGH (ref 65–99)

## 2014-07-27 LAB — BASIC METABOLIC PANEL
ANION GAP: 4 — AB (ref 5–15)
Anion gap: 7 (ref 5–15)
BUN: 103 mg/dL — AB (ref 6–20)
BUN: 96 mg/dL — AB (ref 6–20)
CALCIUM: 10.5 mg/dL — AB (ref 8.9–10.3)
CO2: 23 mmol/L (ref 22–32)
CO2: 22 mmol/L (ref 22–32)
Calcium: 10.2 mg/dL (ref 8.9–10.3)
Chloride: 126 mmol/L — ABNORMAL HIGH (ref 101–111)
Chloride: 126 mmol/L — ABNORMAL HIGH (ref 101–111)
Creatinine, Ser: 1.43 mg/dL — ABNORMAL HIGH (ref 0.61–1.24)
Creatinine, Ser: 1.23 mg/dL (ref 0.61–1.24)
GFR calc Af Amer: 51 mL/min — ABNORMAL LOW (ref >60)
GFR calc non Af Amer: 53 mL/min — ABNORMAL LOW (ref >60)
GFR, EST NON AFRICAN AMERICAN: 44 mL/min — AB (ref >60)
GLUCOSE: 242 mg/dL — AB (ref 65–99)
Glucose, Bld: 235 mg/dL — ABNORMAL HIGH (ref 65–99)
POTASSIUM: 3.8 mmol/L (ref 3.5–5.1)
Potassium: 4.5 mmol/L (ref 3.5–5.1)
Sodium: 156 mmol/L — ABNORMAL HIGH (ref 135–145)
Sodium: 152 mmol/L — ABNORMAL HIGH (ref 135–145)

## 2014-07-27 LAB — GLUCOSE, CAPILLARY
Glucose-Capillary: 129 mg/dL — ABNORMAL HIGH (ref 65–99)
Glucose-Capillary: 166 mg/dL — ABNORMAL HIGH (ref 65–99)
Glucose-Capillary: 168 mg/dL — ABNORMAL HIGH (ref 65–99)
Glucose-Capillary: 177 mg/dL — ABNORMAL HIGH (ref 65–99)
Glucose-Capillary: 186 mg/dL — ABNORMAL HIGH (ref 65–99)
Glucose-Capillary: 212 mg/dL — ABNORMAL HIGH (ref 65–99)
Glucose-Capillary: 311 mg/dL — ABNORMAL HIGH (ref 65–99)

## 2014-07-27 LAB — CBC
HEMATOCRIT: 26.9 % — AB (ref 39.0–52.0)
Hemoglobin: 8.3 g/dL — ABNORMAL LOW (ref 13.0–17.0)
MCH: 30.5 pg (ref 26.0–34.0)
MCHC: 30.9 g/dL (ref 30.0–36.0)
MCV: 98.9 fL (ref 78.0–100.0)
Platelets: 54 10*3/uL — ABNORMAL LOW (ref 150–400)
RBC: 2.72 MIL/uL — AB (ref 4.22–5.81)
RDW: 24.1 % — ABNORMAL HIGH (ref 11.5–15.5)
WBC: 10.4 10*3/uL (ref 4.0–10.5)

## 2014-07-27 LAB — PROCALCITONIN
PROCALCITONIN: 1.85 ng/mL
Procalcitonin: 1.97 ng/mL

## 2014-07-27 LAB — APTT: APTT: 21 s — AB (ref 24–37)

## 2014-07-27 LAB — TSH: TSH: 1.279 u[IU]/mL (ref 0.350–4.500)

## 2014-07-27 LAB — PROTIME-INR
INR: 1.77 — AB (ref 0.00–1.49)
PROTHROMBIN TIME: 20.6 s — AB (ref 11.6–15.2)

## 2014-07-27 LAB — CORTISOL: CORTISOL PLASMA: 16.1 ug/dL

## 2014-07-27 LAB — PREPARE RBC (CROSSMATCH)

## 2014-07-27 MED ORDER — SODIUM CHLORIDE 0.9 % IV BOLUS (SEPSIS)
500.0000 mL | Freq: Once | INTRAVENOUS | Status: AC
  Administered 2014-07-27: 500 mL via INTRAVENOUS

## 2014-07-27 MED ORDER — HYDROCORTISONE NA SUCCINATE PF 100 MG IJ SOLR
50.0000 mg | Freq: Four times a day (QID) | INTRAMUSCULAR | Status: DC
  Administered 2014-07-27 – 2014-07-28 (×6): 50 mg via INTRAVENOUS
  Filled 2014-07-27: qty 1
  Filled 2014-07-27: qty 2
  Filled 2014-07-27 (×4): qty 1
  Filled 2014-07-27: qty 2
  Filled 2014-07-27 (×3): qty 1

## 2014-07-27 MED ORDER — PRO-STAT SUGAR FREE PO LIQD
30.0000 mL | Freq: Two times a day (BID) | ORAL | Status: DC
  Administered 2014-07-27 – 2014-08-08 (×25): 30 mL
  Filled 2014-07-27 (×26): qty 30

## 2014-07-27 MED ORDER — VITAL AF 1.2 CAL PO LIQD
1000.0000 mL | ORAL | Status: DC
  Administered 2014-07-27 – 2014-08-08 (×10): 1000 mL
  Filled 2014-07-27 (×22): qty 1000

## 2014-07-27 MED ORDER — INSULIN ASPART 100 UNIT/ML ~~LOC~~ SOLN: 10.0000 [IU] | Freq: Once | SUBCUTANEOUS | Status: DC

## 2014-07-27 MED ORDER — NOREPINEPHRINE BITARTRATE 1 MG/ML IV SOLN
2.0000 ug/min | INTRAVENOUS | Status: DC
  Administered 2014-07-27: 10 ug/min via INTRAVENOUS
  Administered 2014-07-27: 20 ug/min via INTRAVENOUS
  Filled 2014-07-27 (×2): qty 4

## 2014-07-27 MED ORDER — INSULIN GLARGINE 100 UNIT/ML ~~LOC~~ SOLN
25.0000 [IU] | Freq: Every day | SUBCUTANEOUS | Status: DC
  Administered 2014-07-27 – 2014-07-31 (×5): 25 [IU] via SUBCUTANEOUS
  Filled 2014-07-27 (×6): qty 0.25

## 2014-07-27 MED ORDER — SODIUM CHLORIDE 0.45 % IV SOLN
INTRAVENOUS | Status: DC
  Administered 2014-07-27 – 2014-08-04 (×11): via INTRAVENOUS

## 2014-07-27 MED ORDER — SODIUM CHLORIDE 0.9 % IV SOLN
Freq: Once | INTRAVENOUS | Status: AC
  Administered 2014-07-27: 03:00:00 via INTRAVENOUS

## 2014-07-27 MED ORDER — VITAMIN K1 10 MG/ML IJ SOLN
10.0000 mg | Freq: Once | INTRAVENOUS | Status: AC
  Administered 2014-07-27: 10 mg via INTRAVENOUS
  Filled 2014-07-27: qty 1

## 2014-07-27 NOTE — Consult Note (Addendum)
WOC wound consult note Reason for Consult: Consult requested for multiple wounds.  Family at bedside wanted to assess wounds during the consult. Pt is critically ill and end of life decisions are being discussed.  He is on a Sport low-airloss bed to reduce pressure and has bilat heel lift boots. There are multiple systemic factors that can impair healing including; immobility, incontinence of stool, emaciated, sepsis. Wound type: Sacrum with chronic stage 4 wound; 7X9X2.5cm with bone visible.  90% red, 10% yellow, mod amt yellow drainage, some strong odor, undermining to 1 cm of wound edge.  Right buttock unstageable; 1X1cm, 100% yellow slough, small amt yellow drainage, no odor Right inner ankle 1X1cm stage 1 wound Left inner ankle .8X.8cm stage 1 wound Left outer calf full thickness wound;10X2X.5cm, bone palpable, 50% eschar, 50% red, small amt yellow drainage, small amt odor Left lower calf 5X1cm dark purple deep tissue injury beginning to have loose peeling skin in some locations. Left heel stage 3; .3X1X.3cm, 100% red and dry, no odor or drainage Right heel unstageable; 5X4X.3cm; 50% yellow slough, 10% eschar, 40% red, mod amt yellow drainage, some odor. Pressure Ulcer POA: Yes Dressing procedure/placement/frequency: Discussed plan of care with family; they deny further questions at this time.  They are attempting to decide how aggressive they desire the level of care to be provided.  Moist fluffed gauze to assist with the removal of nonviable tissue.  Please re-consult if further assistance is needed.  Thank-you,  Cammie Mcgee MSN, RN, CWOCN, [place], CNS [phone]

## 2014-07-27 NOTE — Progress Notes (Signed)
Fluids (D5 at 42mL/hr) paused per nursing judgement. 0800 blood sugar via central line >600. Will alert MD.

## 2014-07-27 NOTE — Progress Notes (Signed)
eLink Physician-Brief Progress Note Patient Name: Jason Becker DOB: 11/17/1930 MRN: [id number]  Date of Service  07/27/2014   HPI/Events of Note   sbp 80s, MAP 52 despite fluids and several units prbc per RN  eICU Interventions  hydrocort after sending off cortisol Start levophed- map goal > 65    category: Major Indication: Hypotension  Dennis Killilea 07/27/2014, 12:52 AM

## 2014-07-27 NOTE — H&P (Signed)
PULMONARY / CRITICAL CARE MEDICINE   Name: Jason Becker MRN: [id number] DOB: 22-Nov-1930    ADMISSION DATE:  07/26/2014   PT PROFILE:  28 M [facility] pt with very advanced dementia who has been 100% vent dependent for approx one year with a complicated course over that time including at least one cardiac arrest, decubitus pressure ulcers, infections with resistant organisms (UTI, PNA), recurrent anemia. He was initially intubated for aspiration PAN and underwent trach tube placement for recurrence. Prior to his initial hospitalization one year ago, his dementia was advanced to the point where he required assistance with feeding and his daughter indicates that his intellect/functional status was equivalent that of a [age] yr old. Routine weekly labs were obtained on the day of admission with Hgb 4.8 and Cr 1.8 prompting transfer to [facility] ED where he was initially treated as a code sepsis. A unit of PRBCs was transfused en route to the ED and two more units ordered in the ED  MAJOR EVENTS/TEST RESULTS: 6/22 Admission to [facility] as above  INDWELLING DEVICES:: Trach (chronic) G tube (chronic) LUE PICC (from [facility] - insertion date unknown)  MICRO DATA: Urine 6/22 >>  Resp 6/22 >>  Blood 6/22 >>   ANTIMICROBIALS:  Vanc 6/22 >>  Aztreonam 6/22 >>   SUBJECTIVE: transfused, hyperglcyemia  VITAL SIGNS: Temp:  [96.2 F (35.7 C)-98.4 F (36.9 C)] 98.3 F (36.8 C) (06/23 0825) Pulse Rate:  [51-80] 60 (06/23 1030) Resp:  [18-42] 22 (06/23 1030) BP: (75-142)/(35-68) 124/56 mmHg (06/23 1030) SpO2:  [70 %-100 %] 98 % (06/23 1030) FiO2 (%):  [28 %-30 %] 30 % (06/23 0748) Weight:  [68.947 kg (152 lb)-72.7 kg (160 lb 4.4 oz)] 72.7 kg (160 lb 4.4 oz) (06/23 0506) HEMODYNAMICS:   VENTILATOR SETTINGS: Vent Mode:  [-] PRVC FiO2 (%):  [28 %-30 %] 30 % Set Rate:  [20 bmp-30 bmp] 20 bmp Vt Set:  [500 mL] 500 mL PEEP:  [5 cmH20] 5 cmH20 Plateau Pressure:  [17 cmH20-22 cmH20] 22 cmH20 INTAKE /  OUTPUT:  Intake/Output Summary (Last 24 hours) at 07/27/14 1116 Last data filed at 07/27/14 1000  Gross per 24 hour  Intake 2673.2 ml  Output   1700 ml  Net  973.2 ml    PHYSICAL EXAMINATION: General: Cachectic, staring blankly Neuro: PERRL 3, no spont movement, gag present HEENT: temporal wasting, poor dentition remains Cardiovascular:  s1 s2 RRR Lungs: tachypneic, ronchi Abdomen: scaphoid, diminished BS, nt, G tube present Ext: severe muscle wasting, no LE edema, LUE PICC Skin: Large sacral ulcer purulent, bilateral heel ulcers ulcer on L calf  LABS:  CBC  Recent Labs Lab 07/26/14 1637 07/27/14 0125 07/27/14 0534  WBC 6.1 5.2 10.4  HGB 4.8* 6.4* 8.3*  HCT 16.9* 20.9* 26.9*  PLT 56* 44* 54*   Coag's  Recent Labs Lab 07/27/14 0534  APTT 21*  INR 1.77*   BMET  Recent Labs Lab 07/26/14 1637 07/27/14 0534 07/27/14 0851  NA 156* 156*  --   K 5.1 4.5  --   CL 122* 126*  --   CO2 25 23  --   BUN 130* 103*  --   CREATININE 1.80* 1.43*  --   GLUCOSE 428* 242* 562*   Electrolytes  Recent Labs Lab 07/26/14 1637 07/27/14 0534  CALCIUM 11.4* 10.5*   Sepsis Markers  Recent Labs Lab 07/26/14 1657 07/26/14 1912 07/27/14 0125 07/27/14 0534  LATICACIDVEN 3.99* 4.50*  --   --   PROCALCITON  --   --  1.97 1.85   ABG  Recent Labs Lab 07/26/14 1736  PHART 7.391  PCO2ART 42.2  PO2ART 28.0*   Liver Enzymes  Recent Labs Lab 07/26/14 1637  AST 13*  ALT 12*  ALKPHOS 129*  BILITOT 0.4  ALBUMIN 1.0*   Cardiac Enzymes No results for input(s): TROPONINI, PROBNP in the last 168 hours. Glucose  Recent Labs Lab 07/26/14 1947 07/26/14 2036 07/26/14 2338 07/27/14 0331 07/27/14 0823  GLUCAP 266* 218* 166* 168* >600*    CXR: chronic int changes, hilar changes   ASSESSMENT / PLAN:  PULMONARY A: Chronic VDRF - at this point there is no realistic hope for liberation from the vent P:   Full vent support - known to fial weaning, no SBT  able Vent bundle No pcxr needed Keep same mV  CARDIOVASCULAR A:  H/O cardiac arrest AICD present H/O hypertension PAF Suspect cardiomyopathy/CHF Poor candidate for vasopressors Very poor candidate for ACLS Hypovolemia AI P:  Cont amiodarone Holding clonidine and diltiazem Levophed to map 50, sys 85, after pressors off, no restart , would be futile Keep stress roids  RENAL A:   AKI, likely due to hypotension CKD Severe hypernatremia, free water deficit Not a candidate for HD P:   Monitor BMET am 1/2 NS start at 125 cc/hr, bmet in afternoon Dc d5 as hyperglcyemia Consider bolus saline to get off pressors   GASTROINTESTINAL A:   Chronic G tube Chronic PPI use P:   SUP: enteral pantoprazole TF per protocol ordered  HEMATOLOGIC A:  Chronic anemia Severe acute anemia - suspect acute blood loss coagulapthy P:  DVT px: SCDs  Monitor CBC in pm prbc given , continued transfusion would be futile guaiac stool No need plat tx Dose vit K 10 mg  INFECTIOUS A:   Possible severe sepsis - favor urine source Multiple decubitus ulcers Possible LLL PNA H/O MRSA, pseudomonas, ESBL E coli P:   PCT algorithm unimpressive thus far WOC consultation requested Micro and abx as above  Consider d/w family on rocephin allergy , would favor meropenem  ENDOCRINE A:   Severe hyperglycemia without prior documentation of DM   Hypothyroidism hyperglycemia P:   Lantus 10 units daily SSI protocol Cont L thyroxine @ 75 mcg daily Check TSH AM 6/23 - wnl Increase lantus 25 Use IV reg insulin dose  , dc d5  NEUROLOGIC A:  Very advanced dementia Very severe, irreversible debilitation Chronic pain due to bedbound/ICU status and decubitus ulcers P:   RASS goal: 0,-1 PRN fentanyl   FAMILY/GOALS OF CARE: 6/22 Dr Sung Amabile: I spoke with pt's wife and daughter in person and son over phone. I stated unequivocally that he will not recover in a way that would be regarded as a  favorable outcome. I clarified that there are potential interventions now that are no longer reasonable options and will not be offered. These would include major surgeries, hemodialysis, etc. His wife acknowledged that he is suffering and I implored that we readjust our priorities to put his comfort and dignity highest on the list. There have been, according to records sent from [facility], numerous discussions regarding advanced directives in the past and the son, Luisa Hart, has repeatedly insisted on "do everything". Luisa Hart will be returning from [facility] 6/23 and asked him to be prepared to address this topic again  6/23- This is one of the most horrible cruel cases I have ever seen . There is NO benefit of any heroics and pt requires immediate comfort cvare. Will call son  in and discuss comfort care. If resistance will immediately invoke futility policy. ACLS, cpr, shock, HD, vent, blood all medically ineffective.   CCM time:  30 min   Mcarthur Rossetti. Tyson Alias, MD, FACP Pgr: [phone]  Pulmonary & Critical Care

## 2014-07-27 NOTE — Progress Notes (Signed)
Initial Nutrition Assessment  DOCUMENTATION CODES:  Severe malnutrition in context of chronic illness  INTERVENTION:   Continue TF via PEG, change to Vital AF 1.2 at 25 ml/h and Prostat 30 ml BID on day 1; on day 2, increase to goal rate of 55 ml/h (1320 ml per day) to provide 1784 kcals, 129 gm protein, 1071 ml free water daily.  NUTRITION DIAGNOSIS:  Malnutrition related to chronic illness as evidenced by severe depletion of muscle mass, severe depletion of body fat.   GOAL:  Patient will meet greater than or equal to 90% of their needs   MONITOR:  TF tolerance, Weight trends, Vent status, Labs, Skin, overall goals of care  REASON FOR ASSESSMENT:  Consult Enteral/tube feeding initiation and management  ASSESSMENT:  Patient with very advanced dementia and has been 100% vent dependent for approx one year with a complicated course over that time including decubitus pressure ulcers, infections with resistant organisms (UTI, PNA), recurrent anemia. PEG and trach in place.  Nutrition-Focused physical exam completed. Findings are severe fat depletion, severe muscle depletion, and mild edema. Labs reviewed: sodium elevated.  Currently receiving Vital High Protein  Patient is currently intubated on ventilator support MV: 14 L/min Temp (24hrs), Avg:97.2 F (36.2 C), Min:96.2 F (35.7 C), Max:98.4 F (36.9 C)  Propofol: none ml/hr  Height:  Ht Readings from Last 1 Encounters:  07/26/14 6\' 1"  (1.854 m)    Weight:  Wt Readings from Last 1 Encounters:  07/27/14 160 lb 4.4 oz (72.7 kg)    Ideal Body Weight:  83.6 kg  Wt Readings from Last 10 Encounters:  07/27/14 160 lb 4.4 oz (72.7 kg)    BMI:  Body mass index is 21.15 kg/(m^2).  Estimated Nutritional Needs:  Kcal:  1816  Protein:  115-135 gm  Fluid:  1.8-2 L  Skin:   stage 4 pressure ulcer to sacrum; stage 3 pressure ulcer to bilateral heels; stage 2 pressure ulcer to buttocks; stage 2 wound to left  lower leg; pressure ulcer to left ankle  Diet Order:     EDUCATION NEEDS:  No education needs identified at this time   Intake/Output Summary (Last 24 hours) at 07/27/14 1049 Last data filed at 07/27/14 1000  Gross per 24 hour  Intake 2673.2 ml  Output   1700 ml  Net  973.2 ml    Last BM:  unknown   Joaquin Courts, RD, LDN, CNSC Pager [phone] After Hours Pager [phone]

## 2014-07-27 NOTE — Progress Notes (Signed)
Son, Luisa Hart, and wife are visiting patient.  I spoke with the family telling them that Dr. Tyson Alias needs to speak to the family before 10:30 Friday 07/28/14 and asked if it was possible for the family to be present on the unit by 8:30 in the morning.  Son said he had to take the mother to [facility] for testing, but would do his best to get here before 10:30AM. Again, I reiterated that Dr. Tyson Alias needs to speak with them before 10:30AM.  Son's cell number is [phone] (as documented on visitation contract in patient binder).

## 2014-07-27 NOTE — Progress Notes (Signed)
CRITICAL VALUE ALERT  Critical value received:  Blood glucose 562  Date of notification:  07/27/14  Time of notification:  0930  Critical value read back: yes  Nurse who received alert:  Toniann Fail RN   MD notified (1st page):  MD on floor- Dr. Tyson Alias  Time of first page:  0935  MD notified (2nd page):  Time of second page:  Responding MD:  Dr. Tyson Alias   Time MD responded:  1200- new orders given

## 2014-07-27 NOTE — Progress Notes (Signed)
Notified Dr. Marchelle Gearing regarding patient's low BP (86/41 MAP 52) after 3 L IVF, and 3 units of PRBC's.  Orders received.  Will continue to monitor and assess.

## 2014-07-27 NOTE — Trach Care Team (Signed)
Trach Care Progression Note   Patient Details Name: Jason Becker MRN: [id number] DOB: 06-15-1930 Today's Date: 07/27/2014   Tracheostomy Assessment    Tracheostomy Shiley 8 mm Distal (Active)  Status Secured 07/27/2014 11:14 AM  Site Assessment Clean;Dry 07/27/2014 11:14 AM  Site Care Cleansed;Dried;Dressing applied 07/27/2014  3:46 AM  Inner Cannula Care Other (Comment) 07/26/2014  3:33 PM  Ties Assessment Secure 07/27/2014 11:14 AM  Emergency Equipment at bedside Yes 07/27/2014 11:14 AM     Care Needs     Respiratory Therapy O2 Device: Ventilator FiO2 (%): 30 % SpO2: 99 %    Speech Language Pathology      Physical Therapy      Occupational Therapy      Nutritional Patient's Current Diet: Tube feeding Tube Feeding: Vital High Protein Tube Feeding Frequency: Continuous Tube Feeding Strength: Full strength    Case Management/Social Work      Theatre manager Care Team/Provider Recommendations [facility] Team Members Present-  Cecilia Fordham, RT, [place], [place] Anders Simmonds, NP Chronic trach and plan is to return to [facility].          Demeshia Sherburne, Silva Bandy (scribe for team) 07/27/2014, 2:56 PM

## 2014-07-27 NOTE — Progress Notes (Signed)
eLink Physician-Brief Progress Note Patient Name: Kylee Utecht DOB: 11/12/1930 MRN: [id number]  Date of Service  07/27/2014   HPI/Events of Note    Recent Labs Lab 07/26/14 1637 07/27/14 0125  HGB 4.8* 6.4*     eICU Interventions  Repeat 1 unit prbc      Yader Criger 07/27/2014, 2:44 AM

## 2014-07-27 NOTE — Progress Notes (Signed)
CRITICAL VALUE ALERT  Critical value received:  Hemoglobin 6.4  Date of notification:  07/27/14  Time of notification:  2:03  Critical value read back:Yes.    Nurse who received alert:  Wende Neighbors, RN  MD notified (1st page):  [place]ade Nurse, MD Ramaswamy  Time of first page:  2:03  MD notified (2nd page):  Time of second page:  Responding MD:  MD Marchelle Gearing  Time MD responded:  2:03

## 2014-07-27 NOTE — Clinical Social Work Note (Signed)
Clinical Social Work Assessment  Patient Details  Name: Jason Becker MRN: [id number] Date of Birth: 1930-03-20  Date of referral:  07/27/14               Reason for consult:   (Admitted from Facility )                Permission sought to share information with:  Case Manager, Magazine features editor, Guardian, Family Supports Permission granted to share information::  Yes, Verbal Permission Granted  Name::      Jason Becker )  Agency::   [facility] )  Relationship::   (Son )  Contact Information:   [phone])  Housing/Transportation Living arrangements for the past 2 months:  Skilled Nursing Facility Source of Information:  Adult Children Patient Interpreter Needed:  None Criminal Activity/Legal Involvement Pertinent to Current Situation/Hospitalization:  No - Comment as needed Significant Relationships:  Adult Children, Spouse Lives with:  Facility Resident Do you feel safe going back to the place where you live?  Yes Need for family participation in patient care:  Yes (Comment)  Care giving concerns:  No care giving concerns reported by family at this time.    Social Worker assessment / plan:  No family present at bedside. Clinical Social Worker attempted to contact pt's wife, Jason Becker via telephone however unable to leave a voice message. Clinical Social Worker contacted pt's son, Jason Becker and spoke at length in reference to pt's medical events over the past year. CSW introduced CSW role and readmission for SNF. CSW also reviewed SNF process. CSW reviewed chart and noted that pt has advanced dementia and has been on ventilator at 100% for the past year. Pt's son reported he is pt's HCPOA. Pt's son shared a lengthy timeline of events stating in July of 2015 patient was admitted into [facility] of [place], [place] with a diagnosis of UTI and dehydration. Per pt's son pt remained in [facility] for about 1 week and was transitioned to a SNF in  [place], [place] where he would be receiving IV antibiotics. Pt's son further reported IV antibiotics were administered incorrectly and that pt was not receiving therapeutic rehab- PT/OT to return to his baseline. Pt's son reported that pt then started to experience difficulty ambulating as where he was walking 50 feet before rehab.Pt also developed pressure ulcers in SNF and began receiving wound care after wound expanded to about 4 inches. Per pt's son, pt was transferred back to [facility] as where medical staff consulted the palliative care team and pt declined service. Pt was sent to a [facility] continuing care facility in [place] on October 25, 2013 and then had trach placed back in October of 2015. Pt's son indicated that pt has been in and out of SNF's and hospitals over the past year. Pt's son stated pt has been at [facility] since November 2015 where pt's son has been paying privately [phone] per day. Pt's wife has been living in hotel here in [place] to be closer to pt since November as well. CSW extended emotional support. Pt's son wishes for pt to return to [facility] once medically stable however is not holding a bed at [facility]. MD to discuss goals of care with family at a later date. No further concerns reported by pt's son at this time. Pt's son planning to arrive at [facility] in the next few hours. Pt's dtr from [place] present in [place] with pt's wife. CSW will continue to follow  pt and pt's family for continued support and to facilitate pt's discharge needs once medically stable.   Employment status:  Retired Health and safety inspector:  Medicare PT Recommendations:  Not assessed at this time Information / Referral to community resources:  Skilled Nursing Facility  Patient/Family's Response to care: Pt intubated and disoriented. Pt's son accepting of social work intervention, pleasant and agreeable to pt returning to [facility] SNF if appropriate.   Patient/Family's Understanding of and Emotional Response to  Diagnosis, Current Treatment, and Prognosis: Pt's son aware and agreeable with current treatment however may be unrealistic in regards to severity of pt's medical condition and next stages of care. MD planning to meet with family to discuss comfort measures. CSW remains available.   Emotional Assessment Appearance:   (Unable to assess ) Attitude/Demeanor/Rapport:  Intubated (Following Commands or Not Following Commands) Affect (typically observed):  Unable to Assess Orientation:  Oriented to Self Alcohol / Substance use:  Never Used Psych involvement (Current and /or in the community):  No (Comment)  Discharge Needs  Concerns to be addressed:  No discharge needs identified Readmission within the last 30 days:    Current discharge risk:  None Barriers to Discharge:  No SNF bed   Derenda Fennel, MSW, LCSWA [phone] 07/27/2014 12:25 PM

## 2014-07-27 NOTE — Clinical Social Work Note (Signed)
CSW consult acknowledged:  Clinical Social Worker received consult indicating patient was admitted from facility. CSW reviewed chart and noted patient is from [facility]. CSW to complete psychosocial assessment with patient's family.   CSW remains available as needed.   Derenda Fennel, MSW, LCSWA [phone] 07/27/2014 10:17 AM

## 2014-07-28 DIAGNOSIS — Z515 Encounter for palliative care: Secondary | ICD-10-CM

## 2014-07-28 DIAGNOSIS — K5909 Other constipation: Secondary | ICD-10-CM | POA: Diagnosis present

## 2014-07-28 DIAGNOSIS — I9589 Other hypotension: Secondary | ICD-10-CM

## 2014-07-28 DIAGNOSIS — K5900 Constipation, unspecified: Secondary | ICD-10-CM

## 2014-07-28 DIAGNOSIS — F03C0 Unspecified dementia, severe, without behavioral disturbance, psychotic disturbance, mood disturbance, and anxiety: Secondary | ICD-10-CM | POA: Diagnosis present

## 2014-07-28 DIAGNOSIS — F0390 Unspecified dementia without behavioral disturbance: Secondary | ICD-10-CM | POA: Diagnosis present

## 2014-07-28 LAB — CBC
HCT: 20.9 % — ABNORMAL LOW (ref 39.0–52.0)
HEMATOCRIT: 23.6 % — AB (ref 39.0–52.0)
Hemoglobin: 6.4 g/dL — CL (ref 13.0–17.0)
Hemoglobin: 7.3 g/dL — ABNORMAL LOW (ref 13.0–17.0)
MCH: 30.6 pg (ref 26.0–34.0)
MCH: 30.5 pg (ref 26.0–34.0)
MCHC: 30.6 g/dL (ref 30.0–36.0)
MCHC: 30.9 g/dL (ref 30.0–36.0)
MCV: 100 fL (ref 78.0–100.0)
MCV: 98.7 fL (ref 78.0–100.0)
PLATELETS: 44 10*3/uL — AB (ref 150–400)
PLATELETS: 46 10*3/uL — AB (ref 150–400)
RBC: 2.09 MIL/uL — ABNORMAL LOW (ref 4.22–5.81)
RBC: 2.39 MIL/uL — AB (ref 4.22–5.81)
RDW: 25 % — AB (ref 11.5–15.5)
RDW: 23.9 % — AB (ref 11.5–15.5)
WBC: 5.2 10*3/uL (ref 4.0–10.5)
WBC: 4.3 10*3/uL (ref 4.0–10.5)

## 2014-07-28 LAB — TYPE AND SCREEN
ABO/RH(D): O NEG
ANTIBODY SCREEN: NEGATIVE
UNIT DIVISION: 0
UNIT DIVISION: 0
Unit division: 0

## 2014-07-28 LAB — BASIC METABOLIC PANEL
ANION GAP: 8 (ref 5–15)
BUN: 95 mg/dL — ABNORMAL HIGH (ref 6–20)
CHLORIDE: 121 mmol/L — AB (ref 101–111)
CO2: 22 mmol/L (ref 22–32)
Calcium: 10 mg/dL (ref 8.9–10.3)
Creatinine, Ser: 1.14 mg/dL (ref 0.61–1.24)
GFR calc Af Amer: >60 mL/min (ref >60)
GFR calc non Af Amer: 58 mL/min — ABNORMAL LOW (ref >60)
Glucose, Bld: 201 mg/dL — ABNORMAL HIGH (ref 65–99)
POTASSIUM: 3.9 mmol/L (ref 3.5–5.1)
SODIUM: 151 mmol/L — AB (ref 135–145)

## 2014-07-28 LAB — URINE CULTURE: Culture: >=100000

## 2014-07-28 LAB — PROCALCITONIN: PROCALCITONIN: 1.07 ng/mL

## 2014-07-28 LAB — GLUCOSE, CAPILLARY
GLUCOSE-CAPILLARY: 152 mg/dL — AB (ref 65–99)
GLUCOSE-CAPILLARY: 201 mg/dL — AB (ref 65–99)
GLUCOSE-CAPILLARY: 174 mg/dL — AB (ref 65–99)
GLUCOSE-CAPILLARY: 212 mg/dL — AB (ref 65–99)
GLUCOSE-CAPILLARY: 225 mg/dL — AB (ref 65–99)
GLUCOSE-CAPILLARY: 238 mg/dL — AB (ref 65–99)

## 2014-07-28 LAB — VALPROIC ACID LEVEL

## 2014-07-28 LAB — PROTIME-INR
INR: 1.75 — ABNORMAL HIGH (ref 0.00–1.49)
Prothrombin Time: 20.4 seconds — ABNORMAL HIGH (ref 11.6–15.2)

## 2014-07-28 LAB — OCCULT BLOOD X 1 CARD TO LAB, STOOL: Fecal Occult Bld: NEGATIVE

## 2014-07-28 MED ORDER — SENNOSIDES 8.8 MG/5ML PO SYRP
10.0000 mL | ORAL_SOLUTION | Freq: Two times a day (BID) | ORAL | Status: DC
  Administered 2014-07-28 – 2014-08-04 (×9): 10 mL
  Filled 2014-07-28 (×24): qty 10

## 2014-07-28 MED ORDER — HYDROCORTISONE NA SUCCINATE PF 100 MG IJ SOLR
50.0000 mg | Freq: Two times a day (BID) | INTRAMUSCULAR | Status: DC
  Administered 2014-07-28 – 2014-07-29 (×3): 50 mg via INTRAVENOUS
  Filled 2014-07-28 (×4): qty 1

## 2014-07-28 MED ORDER — VANCOMYCIN HCL 500 MG IV SOLR
500.0000 mg | Freq: Two times a day (BID) | INTRAVENOUS | Status: DC
  Administered 2014-07-28 – 2014-07-30 (×5): 500 mg via INTRAVENOUS
  Filled 2014-07-28 (×8): qty 500

## 2014-07-28 NOTE — Progress Notes (Signed)
CRITICAL VALUE ALERT  Critical value received:  Positive blood cultures; gram + cocci, aerobic bottles  Date of notification:  07/28/14  Time of notification:  3:40  Critical value read back:Yes.    Nurse who received alert:  Windy Fast, RN   MD notified (1st page):  E-link  Time of first page:  3:40  MD notified (2nd page):  Time of second page:  Responding MD:  E-link  Time MD responded:  3:40

## 2014-07-28 NOTE — Progress Notes (Signed)
Inpatient Diabetes Program Recommendations  AACE/ADA: New Consensus Statement on Inpatient Glycemic Control (2013)  Target Ranges:  Prepandial:   less than 140 mg/dL      Peak postprandial:   less than 180 mg/dL (1-2 hours)      Critically ill patients:  140 - 180 mg/dL   Inpatient Diabetes Program Recommendations Insulin - Meal Coverage: consider adding Novolog tube feed coverage 3 units Q4  Thank you  Piedad Climes BSN, RN,CDE Inpatient Diabetes Coordinator [phone] (team pager)

## 2014-07-28 NOTE — Progress Notes (Signed)
ANTIBIOTIC CONSULT NOTE - FOLLOW UP  Pharmacy Consult for vancomycin, aztreonam Indication: rule out pneumonia and Proteus mirabilis UTI  Allergies  Allergen Reactions  . Penicillins Anaphylaxis    Per wife- patient had rash and trouble breathing.   . Other     "Benzathine"  . Rocephin [Ceftriaxone Sodium In Dextrose]     Patient Measurements: Height:  (185.4 cm) Weight: 160 lb 4.4 oz (72.7 kg) IBW/kg (Calculated) : 79.9 Vital Signs: Temp: 97.8 F (36.6 C) (06/24 1111) Temp Source: Oral (06/24 1200) BP: 118/56 mmHg (06/24 1200) Pulse Rate: 60 (06/24 1200) Intake/Output from previous day: 06/23 0701 - 06/24 0700 In: 3867.5 [I.V.:2582.5; NG/GT:985; IV Piggyback:150] Out: 1345 [Urine:1345] Intake/Output from this shift: Total I/O In: 980 [I.V.:625; Other:30; NG/GT:275; IV Piggyback:50] Out: 425 [Urine:425]  Labs:  Recent Labs  07/27/14 0125 07/27/14 0534 07/27/14 1700 07/28/14 0219  WBC 5.2 10.4  --  4.3  HGB 6.4* 8.3*  --  7.3*  PLT 44* 54*  --  46*  CREATININE  --  1.43* 1.23 1.14   Estimated Creatinine Clearance: 52.3 mL/min (by C-G formula based on Cr of 1.14).  Microbiology: Recent Results (from the past 720 hour(s))  Culture, blood (routine x 2)     Status: None (Preliminary result)   Collection Time: 07/26/14  4:35 PM  Result Value Ref Range Status   Specimen Description BLOOD RIGHT ARM  Final   Special Requests BOTTLES DRAWN AEROBIC AND ANAEROBIC 10CC  Final   Culture NO GROWTH < 24 HOURS  Final   Report Status PENDING  Incomplete  Culture, blood (routine x 2)     Status: None (Preliminary result)   Collection Time: 07/26/14  4:42 PM  Result Value Ref Range Status   Specimen Description BLOOD RIGHT HAND  Final   Special Requests BOTTLES DRAWN AEROBIC ONLY 10CC  Final   Culture  Setup Time   Final    GRAM POSITIVE COCCI IN CLUSTERS AEROBIC BOTTLE ONLY CRITICAL RESULT CALLED TO, READ BACK BY AND VERIFIED WITH: MHANCOCK,RN BY TCLEVELAND AT  3:08AM 07/28/14 CONFIRMED BY KBARR,MT    Culture NO GROWTH < 24 HOURS  Final   Report Status PENDING  Incomplete  Urine culture     Status: None   Collection Time: 07/26/14  5:07 PM  Result Value Ref Range Status   Specimen Description URINE, RANDOM  Final   Special Requests NONE  Final   Culture >=100,000 COLONIES/mL PROTEUS MIRABILIS  Final   Report Status 07/28/2014 FINAL  Final   Organism ID, Bacteria PROTEUS MIRABILIS  Final      Susceptibility   Proteus mirabilis - MIC*    AMPICILLIN <=2 SENSITIVE Sensitive     CEFAZOLIN <=4 SENSITIVE Sensitive     CEFTRIAXONE <=1 SENSITIVE Sensitive     CIPROFLOXACIN >=4 RESISTANT Resistant     GENTAMICIN <=1 SENSITIVE Sensitive     IMIPENEM 1 SENSITIVE Sensitive     NITROFURANTOIN 128 RESISTANT Resistant     TRIMETH/SULFA <=20 SENSITIVE Sensitive     AMPICILLIN/SULBACTAM <=2 SENSITIVE Sensitive     PIP/TAZO <=4 SENSITIVE Sensitive     * >=100,000 COLONIES/mL PROTEUS MIRABILIS  MRSA PCR Screening     Status: None   Collection Time: 07/26/14  9:02 PM  Result Value Ref Range Status   MRSA by PCR NEGATIVE NEGATIVE Final    Comment:        The GeneXpert MRSA Assay (FDA approved for NASAL specimens only), is one component of a  comprehensive MRSA colonization surveillance program. It is not intended to diagnose MRSA infection nor to guide or monitor treatment for MRSA infections.    Assessment: 81 year old male with chronic trach from [facility] for r/o sepsis found to have proteus UTI and r/o pneumonia. Patient noted to have multiple pressure wounds including a very large wound on his sacrum. Proteus is resistant to fluoroquinolones and received telephone order to stop Levaquin today. Continuing vancomycin and aztreonam until rule out pneumonia.   WBC is within normal limits. Patient is afebrile. UOP ~0.8 cc/kg/hr. SCr down to 1.14. Estimated and normalized CrCl between 50-46mL/min.   Goal of Therapy:  Vancomycin trough level 15-20  mcg/ml Clinical resolution of infection  Plan:  Will adjust vancomycin to 500mg  IV every 12 hours.  Will continue Aztreonam 1g IV every 8 hours.  If no longer suspect pneumonia, consider narrowing aztreonam to bactrim per tube for proteus UTI.  Will monitor renal function, culture results, and clinical status.   Link Snuffer, PharmD, BCPS Clinical Pharmacist [phone] 07/28/2014,1:19 PM

## 2014-07-28 NOTE — H&P (Signed)
PULMONARY / CRITICAL CARE MEDICINE   Name: Jason Becker MRN: [id number] DOB: Mar 07, 1930    ADMISSION DATE:  07/26/2014   PT PROFILE:  89 M [facility] pt with very advanced dementia who has been 100% vent dependent for approx one year with a complicated course over that time including at least one cardiac arrest, decubitus pressure ulcers, infections with resistant organisms (UTI, PNA), recurrent anemia. He was initially intubated for aspiration PAN and underwent trach tube placement for recurrence. Prior to his initial hospitalization one year ago, his dementia was advanced to the point where he required assistance with feeding and his daughter indicates that his intellect/functional status was equivalent that of a [age] yr old. Routine weekly labs were obtained on the day of admission with Hgb 4.8 and Cr 1.8 prompting transfer to [facility] ED where he was initially treated as a code sepsis. A unit of PRBCs was transfused en route to the ED and two more units ordered in the ED  MAJOR EVENTS/TEST RESULTS: 6/22 Admission to [facility] as above  INDWELLING DEVICES:: Trach (chronic) G tube (chronic) LUE PICC (from [facility] - insertion date unknown)  MICRO DATA: Urine 6/22 >> gram neg rod>>> Resp 6/22 >>  Blood 6/22 >>gram stain GPC clusters stain (culture pend)>>>   ANTIMICROBIALS:  Vanc 6/22 >>  Aztreonam 6/22 >>   SUBJECTIVE: 2.5 liters up, of fpressors 24 hrs  VITAL SIGNS: Temp:  [97.2 F (36.2 C)-98.4 F (36.9 C)] 98.4 F (36.9 C) (06/24 0830) Pulse Rate:  [58-68] 60 (06/24 0800) Resp:  [20-36] 27 (06/24 0800) BP: (79-133)/(40-60) 99/47 mmHg (06/24 0800) SpO2:  [97 %-100 %] 99 % (06/24 0800) FiO2 (%):  [30 %] 30 % (06/24 0800) HEMODYNAMICS:   VENTILATOR SETTINGS: Vent Mode:  [-] PRVC FiO2 (%):  [30 %] 30 % Set Rate:  [20 bmp] 20 bmp Vt Set:  [500 mL] 500 mL PEEP:  [5 cmH20] 5 cmH20 Plateau Pressure:  [15 cmH20-21 cmH20] 15 cmH20 INTAKE / OUTPUT:  Intake/Output Summary (Last 24  hours) at 07/28/14 0851 Last data filed at 07/28/14 0800  Gross per 24 hour  Intake   3885 ml  Output   1345 ml  Net   2540 ml    PHYSICAL EXAMINATION: General: Cachectic, staring blankly Neuro: PERRL, no spont movement, gag present a little HEENT: temporal wasting, poor dentition remains, trach with purulence dc Cardiovascular:  s1 s2 RRR Lungs: coarse Abdomen: scaphoid, diminished BS, nt, G tube present Ext: severe muscle wasting, no LE edema, LUE PICC Skin: Large sacral ulcer purulent, bilateral heel ulcers ulcer on L calf - no changes  LABS:  CBC  Recent Labs Lab 07/27/14 0125 07/27/14 0534 07/28/14 0219  WBC 5.2 10.4 4.3  HGB 6.4* 8.3* 7.3*  HCT 20.9* 26.9* 23.6*  PLT 44* 54* 46*   Coag's  Recent Labs Lab 07/27/14 0534 07/28/14 0219  APTT 21*  --   INR 1.77* 1.75*   BMET  Recent Labs Lab 07/27/14 0534 07/27/14 0851 07/27/14 1700 07/28/14 0219  NA 156*  --  152* 151*  K 4.5  --  3.8 3.9  CL 126*  --  126* 121*  CO2 23  --  22 22  BUN 103*  --  96* 95*  CREATININE 1.43*  --  1.23 1.14  GLUCOSE 242* 562* 235*  231* 201*   Electrolytes  Recent Labs Lab 07/27/14 0534 07/27/14 1700 07/28/14 0219  CALCIUM 10.5* 10.2 10.0   Sepsis Markers  Recent Labs Lab 07/26/14  1657 07/26/14 1912 07/27/14 0125 07/27/14 0534 07/28/14 0219  LATICACIDVEN 3.99* 4.50*  --   --   --   PROCALCITON  --   --  1.97 1.85 1.07   ABG  Recent Labs Lab 07/26/14 1736  PHART 7.391  PCO2ART 42.2  PO2ART 28.0*   Liver Enzymes  Recent Labs Lab 07/26/14 1637  AST 13*  ALT 12*  ALKPHOS 129*  BILITOT 0.4  ALBUMIN 1.0*   Cardiac Enzymes No results for input(s): TROPONINI, PROBNP in the last 168 hours. Glucose  Recent Labs Lab 07/27/14 1959 07/27/14 2018 07/27/14 2323 07/28/14 0122 07/28/14 0443 07/28/14 0829  GLUCAP 186* 177* 129* 152* 201* 174*    CXR: none new   ASSESSMENT / PLAN:  PULMONARY A: Chronic VDRF - at this point there is no  realistic hope for liberation from the vent P:   Full vent support, no changes  CARDIOVASCULAR A:  H/O cardiac arrest AICD present H/O hypertension PAF Suspect cardiomyopathy/CHF Poor candidate for vasopressors Very poor candidate for ACLS Hypovolemia AI P:  Cont amiodarone Holding clonidine and diltiazem pressors off, no restart , would be futile, will not offer Keep stress roids, reduce as off peressors  RENAL A:   AKI, likely due to hypotension CKD Severe hypernatremia, free water deficit Not a candidate for HD P:   Avoid saline Keep 1/2 NS , may need d5w  Chem in am for na  GASTROINTESTINAL A:   Chronic G tube Chronic PPI use P:   SUP: enteral pantoprazole TF per protocol ordered  HEMATOLOGIC A:  Chronic anemia Severe acute anemia - suspect acute blood loss coagulapthy P:  DVT px: SCDs  Monitor CBC in pm prbc given , continued transfusion would be futile guaiac stool awaited No need plat tx Dose vit K 10 mg, no bleeding hold further, no affect Limit phlebotomy  INFECTIOUS A:   Possible severe sepsis - favor urine source Multiple decubitus ulcers Possible LLL PNA H/O MRSA, pseudomonas, ESBL E coli R.o contaminant BC on stain, await growth P:   PCT algorithm unimpressive thus far - but urine is source WOC consultation requested Micro and abx as above  severn pcn allergy, no imipenem to use Await BC result, if mrsa, dc picc Keep vanc for now Await ID gram neg to narrow gram neg coverage to monotherapy  ENDOCRINE A:   Severe hyperglycemia without prior documentation of DM   Hypothyroidism Hyperglycemia improved P:   Lantus to 25, remain as low 129 and roids reducing SSI protocol Cont L thyroxine @ 75 mcg daily Check TSH AM 6/23 - wnl  NEUROLOGIC A:  Very advanced dementia Very severe, irreversible debilitation Chronic pain due to bedbound/ICU status and decubitus ulcers P:   RASS goal: 0,-1 PRN fentanyl Valproic acid level  needed  FAMILY/GOALS OF CARE: 6/22 Dr Sung Amabile: I spoke with pt's wife and daughter in person and son over phone. I stated unequivocally that he will not recover in a way that would be regarded as a favorable outcome. I clarified that there are potential interventions now that are no longer reasonable options and will not be offered. These would include major surgeries, hemodialysis, etc. His wife acknowledged that he is suffering and I implored that we readjust our priorities to put his comfort and dignity highest on the list. There have been, according to records sent from [facility], numerous discussions regarding advanced directives in the past and the son, Luisa Hart, has repeatedly insisted on "do everything". Luisa Hart will be returning  from [place] 6/23 and asked him to be prepared to address this topic again  6/23- This is one of the most horrible cruel cases I have ever seen . There is NO benefit of any heroics and pt requires immediate comfort cvare. Will call son in and discuss comfort care. If resistance will immediately invoke futility policy. ACLS, cpr, shock, HD, vent, blood all medically ineffective.   6/24- called palliative, invoking futility policy, ethics called  To sdu vent, triad  Mcarthur Rossetti. Tyson Alias, MD, FACP Pgr: [phone] [facility]

## 2014-07-28 NOTE — Progress Notes (Signed)
1510 Patient transferred to 2C08. Report given to Lillia Abed, [place]. Patient VSS. Patient appears to be in no discomfort. All questions answered at the bedside.

## 2014-07-28 NOTE — Clinical Documentation Improvement (Signed)
Presents with Sepsis with multi organ failure.   Hypotension documented. BP's 79/43 with Map of 53 to 94/46 with Map of 58 this morning  Levophed initiated  Please provide a diagnosis associated with the above clinical indicators and render an opinion in next progress note and include in discharge summary if indicated.  Septic Shock Cardiogenic Shock Hypovolemic Shock Hemorrhagic Shock Neurogenic Shock Other Condition Cannot Clinically determine  Thank You, Shellee Milo ,RN Clinical Documentation Specialist:  [phone]  [facility]- Health Information Management

## 2014-07-28 NOTE — Consult Note (Signed)
Consultation Note Date: 07/28/2014   Patient Name: Jason Becker  DOB: 10/12/1930  MRN: [id number]  Age / Sex: 81 y.o., male   PCP: Jason Griffes, MD Referring Physician: Wilhelmina Mcardle, MD  Reason for Consultation: Establishing goals of care and Family-clinician negotiation  Palliative Care Assessment and Plan Summary of Established Goals of Care and Medical Treatment Preferences   Clinical Assessment/Narrative: 81 yo SNF, advanced dementia, VDRF x1 yr, decub ulcer, MDR infections (pseudomonas/Staph) admitted with anemia, AKI, hypotension  Contacts/Participants in Discussion: Primary Decision Maker: Family    HCPOA: not documented here or records from [place]  Met today with son Jason Becker (reportedly HCPOA, but family defers regardless), wife Jason Becker.  They had meeting with CCM team today already and discussed their desire for continued aggressive care.  This has been ongoing conversation with them for months (here, [facility], [place], etc).  They are of course frustrated by these conversations because physician teams have had different opinion then them about appropriateness of care and different values about what represents quality of life for Jason Becker.  They have had multiple discussions with palliative care team at [facility] in past as well. He has had at least advanced dementia with loss of mobility since 2014 and obviously had much further decline since then.  The last meaningful interaction son was able to report was about 1 month ago where he could gaze towards him or his mother and occasionally smile.  He believes that a lot of this is result of perceived poor care at [facility]. Feels like infections/wounds were getting under control and he was able to gain weight until May (6 weeks ago was "170lbs and gaining weight from 150lbs) but then had more declines.  We did try to discuss the expected nature of decline with recurrent infections/wounds because of dementia and not poor care.  There is also a  large component of mistrust with the medical community that son has.  He relays stories about both his mother and his dad's brother being ventilated and physicians telling them they would not survive and those predictions being false.  He feels that way about some information relayed by CCM team here.  His goal long term is to get him back to SNF or LTAC where they report paying out of pocket for care.  They describe him as a very outgoing, social person prior to his dementia.  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  He reportedly saw his brother go through cardiac arrest and reportedly witnessed missed prognostication from physicians in past and told Jason Becker that he wanted "everything done to be kept alive".  It sounds like this conversation was again predicated on mistrust rather than life goals/QOL.  My questions about what gives him current meaning/purpose in life are met with a recognition of family and ability to smile last month.    Ethics also arrived for part of our meeting as well and went over ethical situation surrounding care.  Mention was made about what futility policy entails.  Jason Becker has been able to come off pressors and may be transferred to SDU.  It does not appear that there is disagreement about current treatments being provided, but more in situation of further decline in future which would be expected.  I think if/when in future we are talking about a "code situation", aggressive medical interventions such as CPR/defibrillation/etc would potentially be inappropriate.  While family certainly has different values than most regarding what is quality of life, I think  the expected benefits of these interventions are extremely low if at all and they are associated with burden.  I do not see any reason that these interventions would even meet family goal of continuing to allow current level of cognitive function and chance for infections/wounds to heal.  I would be supportive  in CCM following futility policy in this situation as they have outlined today.   I will sign off, as I do not think ongoing conversations/family support are likely to resolve this situation.  It has been an ongoing discussion by multiple providers and multiple institutions. If there is anyway our team could be of benefit to patient/family, would of course be happy to re-engage.       Code Status/Advance Care Planning:  Full  Symptom Management:   Chronic Constipation- will add doc/senna BID through tube. Family very concerned about this as reportedly a recurrent problem.    Psycho-social/Spiritual:   Support System: Son Jason Becker and wife  Desire for further Chaplaincy support:no  Prognosis: < 6 months  Discharge Planning:  SNF       Chief Complaint: Abnormal Labs  History of Present Illness:  81 yo [facility] Resident with advanced dementia, VDRF x1 year, decub ulcers, MDR infections with pseudomonas/staph/e.coli, cardiac arrest who was admitted [facility] 07/[facility]/14 from [facility] after labs noted significant anemia, and AKI.  He has an dismal functional baseline with PPS of 10% record in [facility] records for months.  He was hypotensive on admission and briefly given levophed for this.  He has received PRBCs and several liters of fluid and now off pressors.  Treating infectious sources with broad spectrum abx.  Source possibly PNA vs decub ulcers. CCM has been talking with family as documented.  Patient unable to have any purposeful/meaningful interaction with me.    Primary Diagnoses  Present on Admission:  . Anemia   I have reviewed the medical record, interviewed the patient and family, and examined the patient. The following aspects are pertinent.  Past Medical History  Diagnosis Date  . Alzheimer disease   . Anxiety   . Acute and chronic respiratory failure (acute-on-chronic)   . Cardiac arrest   . Urinary tract infection   . Hypertension   . Hyperlipidemia   . Atrial  fibrillation   . Dysphagia   . GERD (gastroesophageal reflux disease)   . Thyroid disease   . Stage 4 pressure ulcer sacrum   History   Social History  . Marital Status: Married    Spouse Name: N/A  . Number of Children: N/A  . Years of Education: N/A   Social History Main Topics  . Smoking status: Never Smoker   . Smokeless tobacco: Never Used  . Alcohol Use: No  . Drug Use: No  . Sexual Activity: Not on file   Other Topics Concern  . None   Social History Narrative  . None   No family history on file. Scheduled Meds: . amiodarone  200 mg Per Tube Daily  . antiseptic oral rinse  7 mL Mouth Rinse QID  . aztreonam  1 g Intravenous Q8H  . chlorhexidine  15 mL Mouth Rinse BID  . feeding supplement (PRO-STAT SUGAR FREE 64)  30 mL Per Tube BID  . folic acid  1 mg Per Tube Daily  . free water  200 mL Per Tube Q4H  . hydrocortisone sod succinate (SOLU-CORTEF) inj  50 mg Intravenous Q6H  . insulin aspart  0-15 Units Subcutaneous 6 times per day  .  insulin aspart  10 Units Intravenous Once  . insulin glargine  25 Units Subcutaneous QHS  . levofloxacin (LEVAQUIN) IV  750 mg Intravenous Q48H  . levothyroxine  75 mcg Per Tube QAC breakfast  . pantoprazole sodium  40 mg Per Tube Q1200  . Valproic Acid  500 mg Per Tube 3 times per day  . vancomycin  1,000 mg Intravenous Q24H   Continuous Infusions: . sodium chloride 125 mL/hr at 07/27/14 2024  . feeding supplement (VITAL AF 1.2 CAL) 1,000 mL (07/27/14 1743)  . norepinephrine (LEVOPHED) Adult infusion Stopped (07/27/14 1435)   PRN Meds:.sodium chloride, acetaminophen, fentaNYL (SUBLIMAZE) injection, ondansetron (ZOFRAN) IV Medications Prior to Admission:  Prior to Admission medications   Medication Sig Start Date End Date Taking? Authorizing Provider  acetaminophen (TYLENOL) 325 MG tablet 650 mg by PEG Tube route every 4 (four) hours as needed for mild pain or moderate pain.   Yes Historical Provider, MD  amiodarone  (PACERONE) 200 MG tablet 200 mg by PEG Tube route daily.   Yes Historical Provider, MD  bacitracin ointment Apply 1 application topically 2 (two) times daily.   Yes Historical Provider, MD  chlorhexidine (PERIDEX) 0.12 % solution Use as directed 15 mLs in the mouth or throat 2 (two) times daily.   Yes Historical Provider, MD  cloNIDine (CATAPRES) 0.1 MG tablet 0.1 mg by PEG Tube route every 8 (eight) hours.   Yes Historical Provider, MD  dextrose 5 % solution Inject 75 mLs into the vein daily. 73m/hr for hydration   Yes Historical Provider, MD  diltiazem (CARDIZEM) 60 MG tablet Take 120 mg by mouth every 8 (eight) hours.   Yes Historical Provider, MD  docusate (COLACE) 50 MG/5ML liquid Place 200 mg into feeding tube daily.   Yes Historical Provider, MD  HYDROcodone-acetaminophen (NORCO/VICODIN) 5-325 MG per tablet 1 tablet by PEG Tube route every 6 (six) hours as needed for moderate pain.   Yes Historical Provider, MD  ipratropium-albuterol (DUONEB) 0.5-2.5 (3) MG/3ML SOLN Take 3 mLs by nebulization every 6 (six) hours as needed.   Yes Historical Provider, MD  levothyroxine (SYNTHROID, LEVOTHROID) 75 MCG tablet 75 mcg by PEG Tube route daily before breakfast.   Yes Historical Provider, MD  LORazepam (ATIVAN) 2 MG/ML concentrated solution Take 2 mg by mouth every 8 (eight) hours as needed for anxiety (Shortness of Breath).   Yes Historical Provider, MD  omeprazole (PRILOSEC) 40 MG capsule 40 mg by PEG Tube route daily.   Yes Historical Provider, MD  ondansetron (ZOFRAN) 4 MG tablet 4 mg by PEG Tube route every 6 (six) hours as needed for nausea or vomiting.   Yes Historical Provider, MD  Pediatric Multiple Vitamins (MULTIVITAMIN) LIQD Place 5 mLs into feeding tube daily.   Yes Historical Provider, MD  sennosides-docusate sodium (SENOKOT-S) 8.6-50 MG tablet 1 tablet by PEG Tube route daily.   Yes Historical Provider, MD  Valproic Acid (DEPAKENE) 250 MG/5ML SYRP syrup Place 500 mg into feeding tube  every 8 (eight) hours.   Yes Historical Provider, MD  ciprofloxacin (CIPRO) 400 MG/40ML SOLN injection Inject 400 mg into the vein every 12 (twelve) hours.    Historical Provider, MD   Allergies  Allergen Reactions  . Penicillins Anaphylaxis    Per wife- patient had rash and trouble breathing.   . Other     "Benzathine"  . Rocephin [Ceftriaxone Sodium In Dextrose]    CBC:    Component Value Date/Time   WBC 4.3 07/28/2014 0219  HGB 7.3* 07/28/2014 0219   HCT 23.6* 07/28/2014 0219   PLT 46* 07/28/2014 0219   MCV 98.7 07/28/2014 0219   NEUTROABS 4.9 07/26/2014 1637   LYMPHSABS 0.9 07/26/2014 1637   MONOABS 0.2 07/26/2014 1637   EOSABS 0.1 07/26/2014 1637   BASOSABS 0.0 07/26/2014 1637   Comprehensive Metabolic Panel:    Component Value Date/Time   NA 151* 07/28/2014 0219   K 3.9 07/28/2014 0219   CL 121* 07/28/2014 0219   CO2 22 07/28/2014 0219   BUN 95* 07/28/2014 0219   CREATININE 1.14 07/28/2014 0219   GLUCOSE 201* 07/28/2014 0219   CALCIUM 10.0 07/28/2014 0219   AST 13* 07/26/2014 1637   ALT 12* 07/26/2014 1637   ALKPHOS 129* 07/26/2014 1637   BILITOT 0.4 07/26/2014 1637   PROT 6.5 07/26/2014 1637   ALBUMIN 1.0* 07/26/2014 1637    Physical Exam: Vital Signs: BP 99/47 mmHg  Pulse 60  Temp(Src) 98.4 F (36.9 C) (Oral)  Resp 27  Ht '6\' 1"'  (1.854 m)  Wt 72.7 kg (160 lb 4.4 oz)  BMI 21.15 kg/m2  SpO2 99% SpO2: SpO2: 99 % O2 Device: O2 Device: Ventilator O2 Flow Rate:   Intake/output summary:  Intake/Output Summary (Last 24 hours) at 07/28/14 0837 Last data filed at 07/28/14 0800  Gross per 24 hour  Intake   3885 ml  Output   1345 ml  Net   2540 ml   LBM:   Baseline Weight: Weight: 68.947 kg (152 lb) Most recent weight: Weight: 72.7 kg (160 lb 4.4 oz)  Exam Findings:  GEN: eyes open, NAD HEENT: [place], sclera anicteric, temporal wasting NECK: sunken clavicles CV: reg rate ABD: soft, ND, +PEG EXT: edematous Neuro: does not follow commands, no  purposeful interaction with me.          Palliative Performance Scale: 10              Additional Data Reviewed: Recent Labs     07/27/14  0534  07/27/14  1700  07/28/14  0219  WBC  10.4   --   4.3  HGB  8.3*   --   7.3*  PLT  54*   --   46*  NA  156*  152*  151*  BUN  103*  96*  95*  CREATININE  1.43*  1.23  1.14     Time Total: 90 minutes Greater than 50%  of this time was spent counseling and coordinating care related to the above assessment and plan.  Signed by: Isaac Laud, DO  Doran Clay, DO  07/28/2014, 8:37 AM  Please contact Palliative Medicine Team phone at [phone] for questions and concerns.

## 2014-07-29 DIAGNOSIS — D6489 Other specified anemias: Secondary | ICD-10-CM

## 2014-07-29 LAB — GLUCOSE, CAPILLARY
GLUCOSE-CAPILLARY: 203 mg/dL — AB (ref 65–99)
Glucose-Capillary: 130 mg/dL — ABNORMAL HIGH (ref 65–99)
Glucose-Capillary: 165 mg/dL — ABNORMAL HIGH (ref 65–99)
Glucose-Capillary: 166 mg/dL — ABNORMAL HIGH (ref 65–99)
Glucose-Capillary: 200 mg/dL — ABNORMAL HIGH (ref 65–99)
Glucose-Capillary: 219 mg/dL — ABNORMAL HIGH (ref 65–99)

## 2014-07-29 LAB — BASIC METABOLIC PANEL
Anion gap: 4 — ABNORMAL LOW (ref 5–15)
BUN: 80 mg/dL — AB (ref 6–20)
CALCIUM: 9 mg/dL (ref 8.9–10.3)
CO2: 22 mmol/L (ref 22–32)
Chloride: 116 mmol/L — ABNORMAL HIGH (ref 101–111)
Creatinine, Ser: 0.86 mg/dL (ref 0.61–1.24)
GFR calc Af Amer: >60 mL/min (ref >60)
GFR calc non Af Amer: >60 mL/min (ref >60)
Glucose, Bld: 203 mg/dL — ABNORMAL HIGH (ref 65–99)
Potassium: 2.8 mmol/L — ABNORMAL LOW (ref 3.5–5.1)
SODIUM: 142 mmol/L (ref 135–145)

## 2014-07-29 LAB — CBC
HCT: 22.1 % — ABNORMAL LOW (ref 39.0–52.0)
Hemoglobin: 6.9 g/dL — CL (ref 13.0–17.0)
MCH: 30.3 pg (ref 26.0–34.0)
MCHC: 31.2 g/dL (ref 30.0–36.0)
MCV: 96.9 fL (ref 78.0–100.0)
PLATELETS: 45 10*3/uL — AB (ref 150–400)
RBC: 2.28 MIL/uL — AB (ref 4.22–5.81)
RDW: 21.7 % — AB (ref 11.5–15.5)
WBC: 4.7 10*3/uL (ref 4.0–10.5)

## 2014-07-29 MED ORDER — POTASSIUM CHLORIDE 20 MEQ/15ML (10%) PO SOLN
40.0000 meq | Freq: Three times a day (TID) | ORAL | Status: DC
  Administered 2014-07-29 – 2014-08-05 (×21): 40 meq
  Filled 2014-07-29 (×23): qty 30

## 2014-07-29 MED ORDER — ALTEPLASE 2 MG IJ SOLR
2.0000 mg | Freq: Once | INTRAMUSCULAR | Status: DC
  Filled 2014-07-29: qty 2

## 2014-07-29 MED ORDER — HYDROCORTISONE NA SUCCINATE PF 100 MG IJ SOLR
25.0000 mg | Freq: Two times a day (BID) | INTRAMUSCULAR | Status: AC
  Administered 2014-07-30 (×2): 25 mg via INTRAVENOUS
  Filled 2014-07-29 (×3): qty 0.5

## 2014-07-29 NOTE — Progress Notes (Addendum)
CRITICAL VALUE ALERT  Critical value received:  HGB 6.9  Date of notification: 07/29/14  Time of notification:  0900  Critical value read back:yes  Nurse who received alert: Arman Bogus RN  MD notified (1st page): DR [facility] Via text page was informed of earlier Hgb 6.4 but lab requested a redraw as the sample was very diluted

## 2014-07-29 NOTE — Progress Notes (Signed)
[place] TEAM 1 - Stepdown/ICU TEAM Progress Note  Woodward Teply SFK:[id number] DOB: Apr 18, 1930 DOA: 07/26/2014 PCP: Hillary Bow, MD  Admit HPI / Brief Narrative: 78 M who lives in [facility] with advanced dementia who has been 100% vent dependent for approx one year with a complicated course over that time including at least one cardiac arrest, decubitus pressure ulcers, infections with resistant organisms (UTI, PNA), and recurrent anemia. He was initially intubated for aspiration PNA and underwent trach placement for recurrence. Prior to his initial hospitalization one year ago, his dementia had advanced to the point where he required assistance with feeding and his daughter indicated that his intellect/functional status at that point was equivalent to that of a [age] yr old. Routine weekly labs on the day of admission noted a Hgb of 4.8 and Cr 1.8 prompting transfer to [facility] ED where he was initially treated as a code sepsis. A unit of PRBCs was transfused en route to the ED and two more units ordered in the ED.  HPI/Subjective: Lengthy chart review completed.  Patient is examined but is noncommunicative.  There is no family present at time of visit.  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) Per PCCM "at this point there is no realistic hope for liberation from the vent" - no attempts to be made to wean  Chronic anemia w/ severe acute anemia  Per PCCM "continued transfusion would be futile" - Hgb slightly down today, but is consistent w/ last check - follow w/o plan to transfuse at this time   Severe sepsis - Proteus UTI Should be appropriately cover with current antibiotics  H/O cardiac arrest - AICD present  Parox Afib  Normal sinus rhythm at time of exam today  Hypovolemia / Hypotension  Per PCCM "pressors off, no restart, would be futile, will not offer" - blood pressure appears stable at this time  Acute kidney injury on CKD Not a HD candidate per PCCM - renal function is  actually normal presently with GFR >60  Severe hypernatremia Free water deficit - resolving with free water administration  Multiple decubitus ulcers Continue best efforts to treat/avoid progression  ?contaminated blood cx - gram+ cocci clusters 1/2 Awaiting speciation - continue vancomycin until available  H/O MRSA, pseudomonas, ESBL E coli  Hypothyroidism Continue Synthroid therapy  Hyperglycemia Follow with sliding scale insulin  Severe hypokalemia   Treat via feeding tube preferentially - check magnesium in a.m.  Very advanced dementia  Very severe, irreversible debilitation  Code Status: FULL Family Communication: no family present at time of exam Disposition Plan: SDU  Consultants: PCCM Palliative Care Ethics Committee   Antibiotics: Vanc 6/22 >  Aztreonam 6/22 >  DVT prophylaxis: SCDs  Objective: Blood pressure 112/51, pulse 60, temperature 98 F (36.7 C), temperature source Axillary, resp. rate 33, height 6\' 1"  (1.854 m), weight 72.7 kg (160 lb 4.4 oz), SpO2 97 %.  Intake/Output Summary (Last 24 hours) at 07/29/14 1604 Last data filed at 07/29/14 1300  Gross per 24 hour  Intake   5200 ml  Output   2075 ml  Net   3125 ml   Exam: General: No acute respiratory distress - unresponsive Lungs: Coarse upper airway sounds transmitted throughout all fields with no focal crackles or wheezes appreciable otherwise Cardiovascular: Regular rate and rhythm without murmur gallop or rub  Abdomen: Nondistended, soft, bowel sounds hypoactive, no ascites, no appreciable mass - near midline G-tube insertion unremarkable Extremities: No significant cyanosis, clubbing;  2+ right upper extremity and 1+  left upper extremity edema - 1+ edema bilateral lower extremities  Data Reviewed: Basic Metabolic Panel:  Recent Labs Lab 07/26/14 1637 07/27/14 0534 07/27/14 0851 07/27/14 1700 07/28/14 0219 07/29/14 0810  NA 156* 156*  --  152* 151* 142  K 5.1 4.5  --  3.8 3.9  2.8*  CL 122* 126*  --  126* 121* 116*  CO2 25 23  --  22 22 22   GLUCOSE 428* 242* 562* 235*  231* 201* 203*  BUN 130* 103*  --  96* 95* 80*  CREATININE 1.80* 1.43*  --  1.23 1.14 0.86  CALCIUM 11.4* 10.5*  --  10.2 10.0 9.0    CBC:  Recent Labs Lab 07/26/14 1637 07/27/14 0125 07/27/14 0534 07/28/14 0219 07/29/14 0810  WBC 6.1 5.2 10.4 4.3 4.7  NEUTROABS 4.9  --   --   --   --   HGB 4.8* 6.4* 8.3* 7.3* 6.9*  HCT 16.9* 20.9* 26.9* 23.6* 22.1*  MCV 109.7* 100.0 98.9 98.7 96.9  PLT 56* 44* 54* 46* 45*    Liver Function Tests:  Recent Labs Lab 07/26/14 1637  AST 13*  ALT 12*  ALKPHOS 129*  BILITOT 0.4  PROT 6.5  ALBUMIN 1.0*   Coags:  Recent Labs Lab 07/27/14 0534 07/28/14 0219  INR 1.77* 1.75*    Recent Labs Lab 07/27/14 0534  APTT 21*   CBG:  Recent Labs Lab 07/28/14 2041 07/29/14 0001 07/29/14 0432 07/29/14 0752 07/29/14 1255  GLUCAP 238* 219* 165* 203* 200*    Recent Results (from the past 240 hour(s))  Culture, blood (routine x 2)     Status: None (Preliminary result)   Collection Time: 07/26/14  4:35 PM  Result Value Ref Range Status   Specimen Description BLOOD RIGHT ARM  Final   Special Requests BOTTLES DRAWN AEROBIC AND ANAEROBIC 10CC  Final   Culture NO GROWTH 3 DAYS  Final   Report Status PENDING  Incomplete  Culture, blood (routine x 2)     Status: None (Preliminary result)   Collection Time: 07/26/14  4:42 PM  Result Value Ref Range Status   Specimen Description BLOOD RIGHT HAND  Final   Special Requests BOTTLES DRAWN AEROBIC ONLY 10CC  Final   Culture  Setup Time   Final    GRAM POSITIVE COCCI IN CLUSTERS AEROBIC BOTTLE ONLY CRITICAL RESULT CALLED TO, READ BACK BY AND VERIFIED WITH: MHANCOCK,RN BY TCLEVELAND AT 3:08AM 07/28/14 CONFIRMED BY KBARR,MT    Culture GRAM POSITIVE COCCI  Final   Report Status PENDING  Incomplete  Urine culture     Status: None   Collection Time: 07/26/14  5:07 PM  Result Value Ref Range Status     Specimen Description URINE, RANDOM  Final   Special Requests NONE  Final   Culture >=100,000 COLONIES/mL PROTEUS MIRABILIS  Final   Report Status 07/28/2014 FINAL  Final   Organism ID, Bacteria PROTEUS MIRABILIS  Final      Susceptibility   Proteus mirabilis - MIC*    AMPICILLIN <=2 SENSITIVE Sensitive     CEFAZOLIN <=4 SENSITIVE Sensitive     CEFTRIAXONE <=1 SENSITIVE Sensitive     CIPROFLOXACIN >=4 RESISTANT Resistant     GENTAMICIN <=1 SENSITIVE Sensitive     IMIPENEM 1 SENSITIVE Sensitive     NITROFURANTOIN 128 RESISTANT Resistant     TRIMETH/SULFA <=20 SENSITIVE Sensitive     AMPICILLIN/SULBACTAM <=2 SENSITIVE Sensitive     PIP/TAZO <=4 SENSITIVE Sensitive     * >=  100,000 COLONIES/mL PROTEUS MIRABILIS  MRSA PCR Screening     Status: None   Collection Time: 07/26/14  9:02 PM  Result Value Ref Range Status   MRSA by PCR NEGATIVE NEGATIVE Final    Comment:        The GeneXpert MRSA Assay (FDA approved for NASAL specimens only), is one component of a comprehensive MRSA colonization surveillance program. It is not intended to diagnose MRSA infection nor to guide or monitor treatment for MRSA infections.      Studies:   Recent x-ray studies have been reviewed in detail by the Attending Physician  Scheduled Meds:  Scheduled Meds: . alteplase  2 mg Intracatheter Once  . amiodarone  200 mg Per Tube Daily  . antiseptic oral rinse  7 mL Mouth Rinse QID  . aztreonam  1 g Intravenous Q8H  . chlorhexidine  15 mL Mouth Rinse BID  . feeding supplement (PRO-STAT SUGAR FREE 64)  30 mL Per Tube BID  . folic acid  1 mg Per Tube Daily  . free water  200 mL Per Tube Q4H  . hydrocortisone sod succinate (SOLU-CORTEF) inj  50 mg Intravenous Q12H  . insulin aspart  0-15 Units Subcutaneous 6 times per day  . insulin aspart  10 Units Intravenous Once  . insulin glargine  25 Units Subcutaneous QHS  . levothyroxine  75 mcg Per Tube QAC breakfast  . pantoprazole sodium  40 mg Per Tube  Q1200  . sennosides  10 mL Per Tube BID  . Valproic Acid  500 mg Per Tube 3 times per day  . vancomycin  500 mg Intravenous Q12H    Time spent on care of this patient: 35 mins   MCCLUNG,JEFFREY T , MD   [facility] Office  [phone] Pager - Text Page per Loretha Stapler as per below:  On-Call/Text Page:      [email or web address]      password [email or web address]  If 7PM-7AM, please contact night-coverage [email or web address] Password [email or web address] 07/29/2014, 4:04 PM   LOS: 3 days

## 2014-07-30 DIAGNOSIS — N10 Acute tubulo-interstitial nephritis: Secondary | ICD-10-CM

## 2014-07-30 LAB — COMPREHENSIVE METABOLIC PANEL
ALT: 11 U/L — ABNORMAL LOW (ref 17–63)
AST: 13 U/L — AB (ref 15–41)
Albumin: <1 g/dL — ABNORMAL LOW (ref 3.5–5.0)
Alkaline Phosphatase: 82 U/L (ref 38–126)
Anion gap: 4 — ABNORMAL LOW (ref 5–15)
BILIRUBIN TOTAL: 0.3 mg/dL (ref 0.3–1.2)
BUN: 69 mg/dL — ABNORMAL HIGH (ref 6–20)
CO2: 22 mmol/L (ref 22–32)
Calcium: 8.8 mg/dL — ABNORMAL LOW (ref 8.9–10.3)
Chloride: 115 mmol/L — ABNORMAL HIGH (ref 101–111)
Creatinine, Ser: 0.72 mg/dL (ref 0.61–1.24)
GFR calc Af Amer: >60 mL/min (ref >60)
GLUCOSE: 187 mg/dL — AB (ref 65–99)
Potassium: 3.1 mmol/L — ABNORMAL LOW (ref 3.5–5.1)
Sodium: 141 mmol/L (ref 135–145)
TOTAL PROTEIN: 5.7 g/dL — AB (ref 6.5–8.1)

## 2014-07-30 LAB — GLUCOSE, CAPILLARY
GLUCOSE-CAPILLARY: 200 mg/dL — AB (ref 65–99)
Glucose-Capillary: 191 mg/dL — ABNORMAL HIGH (ref 65–99)
Glucose-Capillary: 185 mg/dL — ABNORMAL HIGH (ref 65–99)
Glucose-Capillary: 172 mg/dL — ABNORMAL HIGH (ref 65–99)
Glucose-Capillary: 166 mg/dL — ABNORMAL HIGH (ref 65–99)
Glucose-Capillary: 193 mg/dL — ABNORMAL HIGH (ref 65–99)
Glucose-Capillary: 159 mg/dL — ABNORMAL HIGH (ref 65–99)

## 2014-07-30 LAB — CBC
HCT: 22.9 % — ABNORMAL LOW (ref 39.0–52.0)
Hemoglobin: 7.3 g/dL — ABNORMAL LOW (ref 13.0–17.0)
MCH: 30.4 pg (ref 26.0–34.0)
MCHC: 31.9 g/dL (ref 30.0–36.0)
MCV: 95.4 fL (ref 78.0–100.0)
Platelets: 46 10*3/uL — ABNORMAL LOW (ref 150–400)
RBC: 2.4 MIL/uL — ABNORMAL LOW (ref 4.22–5.81)
RDW: 20.8 % — AB (ref 11.5–15.5)
WBC: 4.9 10*3/uL (ref 4.0–10.5)

## 2014-07-30 LAB — MAGNESIUM: Magnesium: 1.8 mg/dL (ref 1.7–2.4)

## 2014-07-30 NOTE — Progress Notes (Signed)
[place] TEAM 1 - Stepdown/ICU TEAM Progress Note  Jason Becker ZOX:[id number] DOB: 1930-11-08 DOA: 07/26/2014 PCP: Hillary Bow, MD  Admit HPI / Brief Narrative: 33 M who lives in [facility] with advanced dementia who has been 100% vent dependent for approx one year with a complicated course over that time including at least one cardiac arrest, decubitus pressure ulcers, infections with resistant organisms (UTI, PNA), and recurrent anemia. He was initially intubated for aspiration PNA and underwent trach placement for recurrence. Prior to his initial hospitalization one year ago, his dementia had advanced to the point where he required assistance with feeding and his daughter indicated that his intellect/functional status at that point was equivalent to that of a [age] yr old. Routine weekly labs on the day of admission noted a Hgb of 4.8 and Cr 1.8 prompting transfer to [facility] ED where he was initially treated as a code sepsis. A unit of PRBCs was transfused en route to the ED and two more units ordered in the ED.  HPI/Subjective: Patient is examined but is noncommunicative.  There is no family present at time of my visit.  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) Per PCCM "at this point there is no realistic hope for liberation from the vent" - no attempts to be made to wean  Chronic anemia w/ severe acute anemia  Per PCCM "continued transfusion would be futile" - Hgb stable   Severe sepsis - Proteus UTI Should be appropriately cover with current antibiotics  H/O cardiac arrest - AICD present  Parox Afib  Normal sinus rhythm at time of exam today  Hypovolemia / Hypotension  Per PCCM "pressors off, no restart, would be futile, will not offer" - blood pressure appears stable at this time  Acute kidney injury on CKD Not a HD candidate per PCCM - renal function is actually normal presently with GFR >60 - BUN continues to decline   Severe hypernatremia Free water deficit - resolved  with free water administration  Multiple decubitus ulcers Continue best efforts to treat/avoid progression  ?contaminated blood cx - gram+ cocci clusters 1/2 Speciation confirms coag-negative staph suggestive of contaminant - discontinue vancomycin  H/O MRSA, pseudomonas, ESBL E coli  Hypothyroidism Continue Synthroid therapy  Hyperglycemia Follow with sliding scale insulin - reasonably controlled at present  Severe hypokalemia   Cont to treat via feeding tube preferentially - magnesium acceptable   Very advanced dementia  Very severe, irreversible debilitation  Code Status: FULL Family Communication: no family present at time of exam Disposition Plan: SDU  Consultants: PCCM Palliative Care Ethics Committee   Antibiotics: Vanc 6/22 > 6/26 Aztreonam 6/22 >  DVT prophylaxis: SCDs  Objective: Blood pressure 122/70, pulse 60, temperature 96.5 F (35.8 C), temperature source Axillary, resp. rate 31, height 6\' 1"  (1.854 m), weight 72.7 kg (160 lb 4.4 oz), SpO2 98 %.  Intake/Output Summary (Last 24 hours) at 07/30/14 1414 Last data filed at 07/30/14 0900  Gross per 24 hour  Intake   5020 ml  Output   1625 ml  Net   3395 ml   Exam: General: No acute respiratory distress - unresponsive Lungs: Coarse upper airway sounds transmitted throughout all fields with no focal crackles or wheezes appreciable  Cardiovascular: Regular rate and rhythm without murmur  Abdomen: Nondistended, soft, bowel sounds hypoactive, no ascites, no appreciable mass - near midline G-tube insertion unremarkable Extremities: No significant cyanosis, clubbing;  2+ right upper extremity and 1+ left upper extremity edema - 1+ edema bilateral lower  extremities without change  Data Reviewed: Basic Metabolic Panel:  Recent Labs Lab 07/27/14 0534 07/27/14 0851 07/27/14 1700 07/28/14 0219 07/29/14 0810 07/30/14 0528  NA 156*  --  152* 151* 142 141  K 4.5  --  3.8 3.9 2.8* 3.1*  CL 126*  --   126* 121* 116* 115*  CO2 23  --  GLUCOSE 242* 562* 235*  231* 201* 203* 187*  BUN 103*  --  96* 95* 80* 69*  CREATININE 1.43*  --  1.23 1.14 0.86 0.72  CALCIUM 10.5*  --  10.2 10.0 9.0 8.8*  MG  --   --   --   --   --  1.8    CBC:  Recent Labs Lab 07/26/14 1637 07/27/14 0125 07/27/14 0534 07/28/14 0219 07/29/14 0810 07/30/14 0528  WBC 6.1 5.2 10.4 4.3 4.7 4.9  NEUTROABS 4.9  --   --   --   --   --   HGB 4.8* 6.4* 8.3* 7.3* 6.9* 7.3*  HCT 16.9* 20.9* 26.9* 23.6* 22.1* 22.9*  MCV 109.7* 100.0 98.9 98.7 96.9 95.4  PLT 56* 44* 54* 46* 45* 46*    Liver Function Tests:  Recent Labs Lab 07/26/14 1637 07/30/14 0528  AST 13* 13*  ALT 12* 11*  ALKPHOS 129* 82  BILITOT 0.4 0.3  PROT 6.5 5.7*  ALBUMIN 1.0* <1.0*   Coags:  Recent Labs Lab 07/27/14 0534 07/28/14 0219  INR 1.77* 1.75*    Recent Labs Lab 07/27/14 0534  APTT 21*   CBG:  Recent Labs Lab 07/29/14 2101 07/30/14 0007 07/30/14 0356 07/30/14 0808 07/30/14 1203  GLUCAP 130* 191* 200* 185* 172*    Recent Results (from the past 240 hour(s))  Culture, blood (routine x 2)     Status: None (Preliminary result)   Collection Time: 07/26/14  4:35 PM  Result Value Ref Range Status   Specimen Description BLOOD RIGHT ARM  Final   Special Requests BOTTLES DRAWN AEROBIC AND ANAEROBIC 10CC  Final   Culture NO GROWTH 4 DAYS  Final   Report Status PENDING  Incomplete  Culture, blood (routine x 2)     Status: None (Preliminary result)   Collection Time: 07/26/14  4:42 PM  Result Value Ref Range Status   Specimen Description BLOOD RIGHT HAND  Final   Special Requests BOTTLES DRAWN AEROBIC ONLY 10CC  Final   Culture  Setup Time   Final    GRAM POSITIVE COCCI IN CLUSTERS AEROBIC BOTTLE ONLY CRITICAL RESULT CALLED TO, READ BACK BY AND VERIFIED WITH: MHANCOCK,RN BY TCLEVELAND AT 3:08AM 07/28/14 CONFIRMED BY KBARR,MT    Culture   Final    STAPHYLOCOCCUS SPECIES (COAGULASE NEGATIVE) THE  SIGNIFICANCE OF ISOLATING THIS ORGANISM FROM A SINGLE SET OF BLOOD CULTURES WHEN MULTIPLE SETS ARE DRAWN IS UNCERTAIN. PLEASE NOTIFY THE MICROBIOLOGY DEPARTMENT WITHIN ONE WEEK IF SPECIATION AND SENSITIVITIES ARE REQUIRED.    Report Status PENDING  Incomplete  Urine culture     Status: None   Collection Time: 07/26/14  5:07 PM  Result Value Ref Range Status   Specimen Description URINE, RANDOM  Final   Special Requests NONE  Final   Culture >=100,000 COLONIES/mL PROTEUS MIRABILIS  Final   Report Status 07/28/2014 FINAL  Final   Organism ID, Bacteria PROTEUS MIRABILIS  Final      Susceptibility   Proteus mirabilis - MIC*    AMPICILLIN <=2 SENSITIVE Sensitive     CEFAZOLIN <=4 SENSITIVE Sensitive  CEFTRIAXONE <=1 SENSITIVE Sensitive     CIPROFLOXACIN >=4 RESISTANT Resistant     GENTAMICIN <=1 SENSITIVE Sensitive     IMIPENEM 1 SENSITIVE Sensitive     NITROFURANTOIN 128 RESISTANT Resistant     TRIMETH/SULFA <=20 SENSITIVE Sensitive     AMPICILLIN/SULBACTAM <=2 SENSITIVE Sensitive     PIP/TAZO <=4 SENSITIVE Sensitive     * >=100,000 COLONIES/mL PROTEUS MIRABILIS  MRSA PCR Screening     Status: None   Collection Time: 07/26/14  9:02 PM  Result Value Ref Range Status   MRSA by PCR NEGATIVE NEGATIVE Final    Comment:        The GeneXpert MRSA Assay (FDA approved for NASAL specimens only), is one component of a comprehensive MRSA colonization surveillance program. It is not intended to diagnose MRSA infection nor to guide or monitor treatment for MRSA infections.      Studies:   Recent x-ray studies have been reviewed in detail by the Attending Physician  Scheduled Meds:  Scheduled Meds: . alteplase  2 mg Intracatheter Once  . amiodarone  200 mg Per Tube Daily  . antiseptic oral rinse  7 mL Mouth Rinse QID  . aztreonam  1 g Intravenous Q8H  . chlorhexidine  15 mL Mouth Rinse BID  . feeding supplement (PRO-STAT SUGAR FREE 64)  30 mL Per Tube BID  . folic acid  1 mg Per  Tube Daily  . free water  200 mL Per Tube Q4H  . hydrocortisone sod succinate (SOLU-CORTEF) inj  25 mg Intravenous Q12H  . insulin aspart  0-15 Units Subcutaneous 6 times per day  . insulin aspart  10 Units Intravenous Once  . insulin glargine  25 Units Subcutaneous QHS  . levothyroxine  75 mcg Per Tube QAC breakfast  . pantoprazole sodium  40 mg Per Tube Q1200  . potassium chloride  40 mEq Per Tube TID  . sennosides  10 mL Per Tube BID  . Valproic Acid  500 mg Per Tube 3 times per day  . vancomycin  500 mg Intravenous Q12H    Time spent on care of this patient: 35 mins   Jayon Matton T , MD   [facility] Office  [phone] Pager - Text Page per Loretha Stapler as per below:  On-Call/Text Page:      [email or web address]      password [email or web address]  If 7PM-7AM, please contact night-coverage [email or web address] Password [email or web address] 07/30/2014, 2:14 PM   LOS: 4 days

## 2014-07-31 LAB — CBC
HEMATOCRIT: 23.4 % — AB (ref 39.0–52.0)
HEMOGLOBIN: 7.2 g/dL — AB (ref 13.0–17.0)
MCH: 30 pg (ref 26.0–34.0)
MCHC: 30.8 g/dL (ref 30.0–36.0)
MCV: 97.5 fL (ref 78.0–100.0)
Platelets: 47 10*3/uL — ABNORMAL LOW (ref 150–400)
RBC: 2.4 MIL/uL — ABNORMAL LOW (ref 4.22–5.81)
RDW: 19.9 % — ABNORMAL HIGH (ref 11.5–15.5)
WBC: 4.7 10*3/uL (ref 4.0–10.5)

## 2014-07-31 LAB — BASIC METABOLIC PANEL
Anion gap: 4 — ABNORMAL LOW (ref 5–15)
BUN: 57 mg/dL — ABNORMAL HIGH (ref 6–20)
CHLORIDE: 116 mmol/L — AB (ref 101–111)
CO2: 21 mmol/L — ABNORMAL LOW (ref 22–32)
Calcium: 8.6 mg/dL — ABNORMAL LOW (ref 8.9–10.3)
Creatinine, Ser: 0.57 mg/dL — ABNORMAL LOW (ref 0.61–1.24)
GFR calc Af Amer: >60 mL/min (ref >60)
GLUCOSE: 173 mg/dL — AB (ref 65–99)
Potassium: 4 mmol/L (ref 3.5–5.1)
Sodium: 141 mmol/L (ref 135–145)

## 2014-07-31 LAB — CULTURE, BLOOD (ROUTINE X 2): Culture: NO GROWTH

## 2014-07-31 LAB — GLUCOSE, CAPILLARY
GLUCOSE-CAPILLARY: 110 mg/dL — AB (ref 65–99)
GLUCOSE-CAPILLARY: 156 mg/dL — AB (ref 65–99)
Glucose-Capillary: 164 mg/dL — ABNORMAL HIGH (ref 65–99)
Glucose-Capillary: 142 mg/dL — ABNORMAL HIGH (ref 65–99)
Glucose-Capillary: 94 mg/dL (ref 65–99)
Glucose-Capillary: 80 mg/dL (ref 65–99)

## 2014-07-31 NOTE — Progress Notes (Signed)
[place] TEAM 1 - Stepdown/ICU TEAM Progress Note  Jason Becker [id number] DOB: Mar 16, 1930 DOA: 07/26/2014 PCP: Hillary BowROWLEY, MCKAY, MD  Admit HPI / Brief Narrative: [age] M who lives in [facility] with advanced dementia who has been 100% vent dependent for approx one year with a complicated course over that time including at least one cardiac arrest, decubitus pressure ulcers, infections with resistant organisms (UTI, PNA), and recurrent anemia. He was initially intubated for aspiration PNA and underwent trach placement for recurrence. Prior to his initial hospitalization one year ago, his dementia had advanced to the point where he required assistance with feeding and his daughter indicated that his intellect/functional status at that point was equivalent to that of a [age] yr old. Routine weekly labs on the day of admission noted a Hgb of 4.8 and Cr 1.8 prompting [facility] where he was initially treated as a code sepsis. A unit of PRBCs was transfused en route to the ED and two more units ordered in the ED.  HPI/Subjective: Patient is examined but is noncommunicative.  There is no family present at time of my visit.  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) Per PCCM "at this point there is no realistic hope for liberation from the vent" - no attempts to be made to wean  Chronic anemia w/ severe acute anemia  Per PCCM "continued transfusion would be futile" - Hgb stable   Severe sepsis / septic shock - Proteus UTI Should be appropriately cover with current antibiotics - plan to complete a 10 day tx course and follow   H/O cardiac arrest - AICD present  Parox Afib  Normal sinus rhythm presently   Hypovolemia / Hypotension  Per PCCM "pressors off, no restart, would be futile, will not offer" - blood pressure stable at this time  Acute kidney injury on CKD Not a HD candidate per PCCM - renal function is actually normal presently with GFR >60 - BUN continues to decline - follow  trend   Severe hypernatremia Free water deficit - resolved with free water administration  Multiple decubitus ulcers Continue best efforts to treat/avoid progression  ?contaminated blood cx - gram+ cocci clusters 1/2 Speciation confirms coag-negative staph suggestive of contaminant - discontinued vancomycin  H/O MRSA, pseudomonas, ESBL E coli  Hypothyroidism Continue Synthroid therapy  Hyperglycemia Follow with sliding scale insulin - reasonably controlled at present  Severe hypokalemia   Corrected to normal   Very advanced dementia  Very severe, irreversible debilitation  Code Status: FULL Family Communication: no family present at time of exam Disposition Plan: SDU - should be medically ready for disposition within 48hrs   Consultants: PCCM Palliative Care Ethics Committee   Antibiotics: Vanc 6/22 > 6/26 Aztreonam 6/22 >  DVT prophylaxis: SCDs  Objective: Blood pressure 121/57, pulse 71, temperature 96.7 F (35.9 C), temperature source Oral, resp. rate 46, height 6\' 1"  (1.854 m), weight 72.7 kg (160 lb 4.4 oz), SpO2 100 %.  Intake/Output Summary (Last 24 hours) at 07/31/14 1538 Last data filed at 07/31/14 1400  Gross per 24 hour  Intake 5605.83 ml  Output   2400 ml  Net 3205.83 ml   Exam: General: No acute respiratory distress - noncommunicative  Lungs: No focal crackles or wheezes appreciable  Cardiovascular: Regular rate and rhythm without murmur  Abdomen: Nondistended, soft, bowel sounds hypoactive, no ascites, no appreciable mass - G-tube insertion unremarkable Extremities: No significant cyanosis, clubbing;  2+ right upper extremity and 1+ left upper extremity edema - 1+  edema bilateral lower extremities without change  Data Reviewed: Basic Metabolic Panel:  Recent Labs Lab 07/27/14 1700 07/28/14 0219 07/29/14 0810 07/30/14 0528 07/31/14 0500  NA 152* 151* 142 141 141  K 3.8 3.9 2.8* 3.1* 4.0  CL 126* 121* 116* 115* 116*  CO2 21*  GLUCOSE 235*  231* 201* 203* 187* 173*  BUN 96* 95* 80* 69* 57*  CREATININE 1.23 1.14 0.86 0.72 0.57*  CALCIUM 10.2 10.0 9.0 8.8* 8.6*  MG  --   --   --  1.8  --     CBC:  Recent Labs Lab 07/26/14 1637  07/27/14 0534 07/28/14 0219 07/29/14 0810 07/30/14 0528 07/31/14 0500  WBC 6.1  < > 10.4 4.3 4.7 4.9 4.7  NEUTROABS 4.9  --   --   --   --   --   --   HGB 4.8*  < > 8.3* 7.3* 6.9* 7.3* 7.2*  HCT 16.9*  < > 26.9* 23.6* 22.1* 22.9* 23.4*  MCV 109.7*  < > 98.9 98.7 96.9 95.4 97.5  PLT 56*  < > 54* 46* 45* 46* 47*  < > = values in this interval not displayed.  Liver Function Tests:  Recent Labs Lab 07/26/14 1637 07/30/14 0528  AST 13* 13*  ALT 12* 11*  ALKPHOS 129* 82  BILITOT 0.4 0.3  PROT 6.5 5.7*  ALBUMIN 1.0* <1.0*   Coags:  Recent Labs Lab 07/27/14 0534 07/28/14 0219  INR 1.77* 1.75*    Recent Labs Lab 07/27/14 0534  APTT 21*   CBG:  Recent Labs Lab 07/30/14 2016 07/31/14 0032 07/31/14 0336 07/31/14 0745 07/31/14 1144  GLUCAP 159* 156* 164* 142* 110*    Recent Results (from the past 240 hour(s))  Culture, blood (routine x 2)     Status: None (Preliminary result)   Collection Time: 07/26/14  4:35 PM  Result Value Ref Range Status   Specimen Description BLOOD RIGHT ARM  Final   Special Requests BOTTLES DRAWN AEROBIC AND ANAEROBIC 10CC  Final   Culture NO GROWTH 4 DAYS  Final   Report Status PENDING  Incomplete  Culture, blood (routine x 2)     Status: None (Preliminary result)   Collection Time: 07/26/14  4:42 PM  Result Value Ref Range Status   Specimen Description BLOOD RIGHT HAND  Final   Special Requests BOTTLES DRAWN AEROBIC ONLY 10CC  Final   Culture  Setup Time   Final    GRAM POSITIVE COCCI IN CLUSTERS AEROBIC BOTTLE ONLY CRITICAL RESULT CALLED TO, READ BACK BY AND VERIFIED WITH: MHANCOCK,RN BY TCLEVELAND AT 3:08AM 07/28/14 CONFIRMED BY KBARR,MT    Culture   Final    STAPHYLOCOCCUS SPECIES (COAGULASE NEGATIVE) THE  SIGNIFICANCE OF ISOLATING THIS ORGANISM FROM A SINGLE SET OF BLOOD CULTURES WHEN MULTIPLE SETS ARE DRAWN IS UNCERTAIN. PLEASE NOTIFY THE MICROBIOLOGY DEPARTMENT WITHIN ONE WEEK IF SPECIATION AND SENSITIVITIES ARE REQUIRED.    Report Status PENDING  Incomplete  Urine culture     Status: None   Collection Time: 07/26/14  5:07 PM  Result Value Ref Range Status   Specimen Description URINE, RANDOM  Final   Special Requests NONE  Final   Culture >=100,000 COLONIES/mL PROTEUS MIRABILIS  Final   Report Status 07/28/2014 FINAL  Final   Organism ID, Bacteria PROTEUS MIRABILIS  Final      Susceptibility   Proteus mirabilis - MIC*    AMPICILLIN <=2 SENSITIVE Sensitive     CEFAZOLIN <=  4 SENSITIVE Sensitive     CEFTRIAXONE <=1 SENSITIVE Sensitive     CIPROFLOXACIN >=4 RESISTANT Resistant     GENTAMICIN <=1 SENSITIVE Sensitive     IMIPENEM 1 SENSITIVE Sensitive     NITROFURANTOIN 128 RESISTANT Resistant     TRIMETH/SULFA <=20 SENSITIVE Sensitive     AMPICILLIN/SULBACTAM <=2 SENSITIVE Sensitive     PIP/TAZO <=4 SENSITIVE Sensitive     * >=100,000 COLONIES/mL PROTEUS MIRABILIS  MRSA PCR Screening     Status: None   Collection Time: 07/26/14  9:02 PM  Result Value Ref Range Status   MRSA by PCR NEGATIVE NEGATIVE Final    Comment:        The GeneXpert MRSA Assay (FDA approved for NASAL specimens only), is one component of a comprehensive MRSA colonization surveillance program. It is not intended to diagnose MRSA infection nor to guide or monitor treatment for MRSA infections.      Studies:   Recent x-ray studies have been reviewed in detail by the Attending Physician  Scheduled Meds:  Scheduled Meds: . alteplase  2 mg Intracatheter Once  . amiodarone  200 mg Per Tube Daily  . antiseptic oral rinse  7 mL Mouth Rinse QID  . aztreonam  1 g Intravenous Q8H  . chlorhexidine  15 mL Mouth Rinse BID  . feeding supplement (PRO-STAT SUGAR FREE 64)  30 mL Per Tube BID  . folic acid  1 mg Per  Tube Daily  . free water  200 mL Per Tube Q4H  . insulin aspart  0-15 Units Subcutaneous 6 times per day  . insulin aspart  10 Units Intravenous Once  . insulin glargine  25 Units Subcutaneous QHS  . levothyroxine  75 mcg Per Tube QAC breakfast  . pantoprazole sodium  40 mg Per Tube Q1200  . potassium chloride  40 mEq Per Tube TID  . sennosides  10 mL Per Tube BID  . Valproic Acid  500 mg Per Tube 3 times per day    Time spent on care of this patient: 25 mins   [facility] T , MD   [facility] Office  [phone] Pager - Text Page per Loretha Stapler as per below:  On-Call/Text Page:      [email or web address]      password [email or web address]  If 7PM-7AM, please contact night-coverage [email or web address] Password [facility] 07/31/2014, 3:38 PM   LOS: 5 days

## 2014-07-31 NOTE — Care Management Note (Addendum)
Case Management Note  Patient Details  Name: Jason Becker MRN: [id number] Date of Birth: 10-29-1930  Subjective/Objective:     Per CSW, pt's son, Jason Becker, is requesting transfer to LTAC.  Contacted [facility] to learn that pt has 30 reserve days, is not appropriate for rehab and they will are unable to accept pt.  [place] [facility] [place] [facility].  Search revealed that [facility] and will not offer a bed.  Left VM message for Admissions Coordinator @ [facility] in [place] requesting return call.                       Expected Discharge Plan:  Skilled Nursing Facility  In-House Referral:  Clinical Social Work  Discharge planning Services  CM Consult  Post Acute Care Choice:    Choice offered to:     DME Arranged:    DME Agency:     HH Arranged:    HH Agency:     Status of Service:  In process, will continue to follow   Magdalene RiverMayo, Brown Dunlap T, RN 07/31/2014, 3:40 PM    [facility] and received telephone call from [facility] Coord[facility] stating they will not be able to admit pt.   Rationale remains the same - pt has no rehab potential and only has 30 Medicare days remaining.  TC to spouse who requests CM call son, Jason Becker.  Provided information to Jason Becker who is now requesting vent SNF search - CSW aware.

## 2014-07-31 NOTE — Progress Notes (Addendum)
CSW was informed by RN that pt son is not sure about pt return to [facility].  CSW called pt son to discuss.  Pt son feels as if some of pt current decline could have been prevented/mitigated by better care on the facility's part.  Pt son has done extensive research into Vent SNF facilities and feels as if most the facilities are ill equiped to deal with the pt current medical needs (mainly wound care) and would [facility] before returning to long term SN[facility].   Pt son reports that pt has not been in hospital/LTACH since November 2015 and believes that his benefits would have [facility].  Pt is hopeful for LTACH placement in [place] and did not want to discuss an alternative plan until he knew whether or not the [facility] could [facility]- would not say definitively whether or not he wanted a different SNF.  CSW informed RNCM of pt [facility] and is determining whether or not pt has LTACH benefits at this time  CSW will continue to follow.  Merlyn LotJenna Holoman, LCSWA Clinical Social Worker [phone]

## 2014-07-31 NOTE — Progress Notes (Signed)
ANTIBIOTIC CONSULT NOTE - FOLLOW UP  Pharmacy Consult for Aztreonam Indication: sepsis  Allergies  Allergen Reactions  . Penicillins Anaphylaxis    Per wife- patient had rash and trouble breathing.   . Other     "Benzathine"  . Rocephin [Ceftriaxone Sodium In Dextrose]     Patient Measurements: Height:  (185.4 cm) Weight: 160 lb 4.4 oz (72.7 kg) IBW/kg (Calculated) : 79.9 Adjusted Body Weight:    Vital Signs: Temp: 96.7 F (35.9 C) (06/27 1144) Temp Source: Oral (06/27 1144) BP: 121/57 mmHg (06/27 1144) Pulse Rate: 71 (06/27 1144) Intake/Output from previous day: 06/26 0701 - 06/27 0700 In: 5285.8 [I.V.:2180.8; NG/GT:2905; IV Piggyback:200] Out: 1550 [Urine:1550] Intake/Output from this shift: Total I/O In: -  Out: 850 [Urine:850]  Labs:  Recent Labs  07/29/14 0810 07/30/14 0528 07/31/14 0500  WBC 4.7 4.9 4.7  HGB 6.9* 7.3* 7.2*  PLT 45* 46* 47*  CREATININE 0.86 0.72 0.57*   Estimated Creatinine Clearance: 74.5 mL/min (by C-G formula based on Cr of 0.57). No results for input(s): VANCOTROUGH, VANCOPEAK, VANCORANDOM, GENTTROUGH, GENTPEAK, GENTRANDOM, TOBRATROUGH, TOBRAPEAK, TOBRARND, AMIKACINPEAK, AMIKACINTROU, AMIKACIN in the last 72 hours.   Microbiology: Recent Results (from the past 720 hour(s))  Culture, blood (routine x 2)     Status: None (Preliminary result)   Collection Time: 07/26/14  4:35 PM  Result Value Ref Range Status   Specimen Description BLOOD RIGHT ARM  Final   Special Requests BOTTLES DRAWN AEROBIC AND ANAEROBIC 10CC  Final   Culture NO GROWTH 4 DAYS  Final   Report Status PENDING  Incomplete  Culture, blood (routine x 2)     Status: None (Preliminary result)   Collection Time: 07/26/14  4:42 PM  Result Value Ref Range Status   Specimen Description BLOOD RIGHT HAND  Final   Special Requests BOTTLES DRAWN AEROBIC ONLY 10CC  Final   Culture  Setup Time   Final    GRAM POSITIVE COCCI IN CLUSTERS AEROBIC BOTTLE ONLY CRITICAL  RESULT CALLED TO, READ BACK BY AND VERIFIED WITH: MHANCOCK,RN BY TCLEVELAND AT 3:08AM 07/28/14 CONFIRMED BY KBARR,MT    Culture   Final    STAPHYLOCOCCUS SPECIES (COAGULASE NEGATIVE) THE SIGNIFICANCE OF ISOLATING THIS ORGANISM FROM A SINGLE SET OF BLOOD CULTURES WHEN MULTIPLE SETS ARE DRAWN IS UNCERTAIN. PLEASE NOTIFY THE MICROBIOLOGY DEPARTMENT WITHIN ONE WEEK IF SPECIATION AND SENSITIVITIES ARE REQUIRED.    Report Status PENDING  Incomplete  Urine culture     Status: None   Collection Time: 07/26/14  5:07 PM  Result Value Ref Range Status   Specimen Description URINE, RANDOM  Final   Special Requests NONE  Final   Culture >=100,000 COLONIES/mL PROTEUS MIRABILIS  Final   Report Status 07/28/2014 FINAL  Final   Organism ID, Bacteria PROTEUS MIRABILIS  Final      Susceptibility   Proteus mirabilis - MIC*    AMPICILLIN <=2 SENSITIVE Sensitive     CEFAZOLIN <=4 SENSITIVE Sensitive     CEFTRIAXONE <=1 SENSITIVE Sensitive     CIPROFLOXACIN >=4 RESISTANT Resistant     GENTAMICIN <=1 SENSITIVE Sensitive     IMIPENEM 1 SENSITIVE Sensitive     NITROFURANTOIN 128 RESISTANT Resistant     TRIMETH/SULFA <=20 SENSITIVE Sensitive     AMPICILLIN/SULBACTAM <=2 SENSITIVE Sensitive     PIP/TAZO <=4 SENSITIVE Sensitive     * >=100,000 COLONIES/mL PROTEUS MIRABILIS  MRSA PCR Screening     Status: None   Collection Time: 07/26/14  9:02 PM  Result Value Ref Range Status   MRSA by PCR NEGATIVE NEGATIVE Final    Comment:        The GeneXpert MRSA Assay (FDA approved for NASAL specimens only), is one component of a comprehensive MRSA colonization surveillance program. It is not intended to diagnose MRSA infection nor to guide or monitor treatment for MRSA infections.     Anti-infectives    Start     Dose/Rate Route Frequency Ordered Stop   07/28/14 1600  levofloxacin (LEVAQUIN) IVPB 750 mg  Status:  Discontinued     750 mg 100 mL/hr over 90 Minutes Intravenous Every 48 hours 07/26/14 2032  07/28/14 1317   07/28/14 1330  vancomycin (VANCOCIN) 500 mg in sodium chloride 0.9 % 100 mL IVPB  Status:  Discontinued     500 mg 100 mL/hr over 60 Minutes Intravenous Every 12 hours 07/28/14 1328 07/30/14 1812   07/27/14 1800  vancomycin (VANCOCIN) IVPB 1000 mg/200 mL premix  Status:  Discontinued     1,000 mg 200 mL/hr over 60 Minutes Intravenous Every 24 hours 07/26/14 2032 07/28/14 1328   07/27/14 0100  aztreonam (AZACTAM) 1 g in dextrose 5 % 50 mL IVPB     1 g 100 mL/hr over 30 Minutes Intravenous Every 8 hours 07/26/14 2032     07/26/14 1700  vancomycin (VANCOCIN) 1,250 mg in sodium chloride 0.9 % 250 mL IVPB     1,250 mg 166.7 mL/hr over 90 Minutes Intravenous  Once 07/26/14 1650 07/26/14 1931   07/26/14 1645  levofloxacin (LEVAQUIN) IVPB 750 mg     750 mg 100 mL/hr over 90 Minutes Intravenous  Once 07/26/14 1633 07/26/14 2008   07/26/14 1645  aztreonam (AZACTAM) 2 g in dextrose 5 % 50 mL IVPB     2 g 100 mL/hr over 30 Minutes Intravenous  Once 07/26/14 1633 07/26/14 1752   07/26/14 1645  vancomycin (VANCOCIN) IVPB 1000 mg/200 mL premix  Status:  Discontinued     1,000 mg 200 mL/hr over 60 Minutes Intravenous  Once 07/26/14 1633 07/26/14 1650      Assessment:  83 yom chronic trach-vent dependent from [facility]. Received several units of blood on admit. Pharmacy consulted to dose vanc/aztreonam/levo for sepsis  PMH: Alzheimer disease; Anxiety; hx Cardiac arrest; HTN; HLD; Atrial fibrillation; Dysphagia; GERD; Thyroid disease; and Stage 4 pressure ulcer (sacrum), Trach  ID: day # 6 Aztreonam for sepsis (HCAP vs UTI - UA dirty). Completed 8d Cipro on 6/21. WBC wnl, afeb, PCT 1.07. Wounds -sacrum, heels, legs per RN. Avoiding carbapenem at this time d/t severe PCN rxn noting hx of ESBL infection (no data on what type), MRSA, Pseudomonas (pan sens per [facility] notes). Pus from trach site?  6/22 vanc>>6/26 6/22 levo>>6/24 6/22 aztreonam>>  6/22 BCx2 - gm stain GPC clusters on 1 of  2 -> CoNS, ng x 4 days so far in 2nd cx 6/22 UC- 100K Proteus (R-FQ/Nitrof; S-PCN/Ceph, Imi, Bactrim) 6/22 MRSA pcr negative  Goal of Therapy:  Eradication of infection  Plan:  Continue Aztreonam 1 g q8h    Demeshia Sherburne S. Merilynn Finland, PharmD, BCPS Clinical Staff Pharmacist Pager [phone]  Misty Stanley Stillinger 07/31/2014,1:31 PM

## 2014-08-01 DIAGNOSIS — Z9911 Dependence on respirator [ventilator] status: Secondary | ICD-10-CM | POA: Diagnosis present

## 2014-08-01 DIAGNOSIS — L8990 Pressure ulcer of unspecified site, unspecified stage: Secondary | ICD-10-CM | POA: Diagnosis present

## 2014-08-01 DIAGNOSIS — D638 Anemia in other chronic diseases classified elsewhere: Secondary | ICD-10-CM

## 2014-08-01 DIAGNOSIS — A419 Sepsis, unspecified organism: Secondary | ICD-10-CM | POA: Diagnosis present

## 2014-08-01 DIAGNOSIS — I9589 Other hypotension: Secondary | ICD-10-CM | POA: Diagnosis present

## 2014-08-01 DIAGNOSIS — E870 Hyperosmolality and hypernatremia: Secondary | ICD-10-CM

## 2014-08-01 DIAGNOSIS — E861 Hypovolemia: Secondary | ICD-10-CM | POA: Diagnosis present

## 2014-08-01 DIAGNOSIS — R6520 Severe sepsis without septic shock: Secondary | ICD-10-CM

## 2014-08-01 DIAGNOSIS — E876 Hypokalemia: Secondary | ICD-10-CM | POA: Diagnosis present

## 2014-08-01 DIAGNOSIS — F0390 Unspecified dementia without behavioral disturbance: Secondary | ICD-10-CM

## 2014-08-01 DIAGNOSIS — I480 Paroxysmal atrial fibrillation: Secondary | ICD-10-CM

## 2014-08-01 DIAGNOSIS — E038 Other specified hypothyroidism: Secondary | ICD-10-CM | POA: Diagnosis present

## 2014-08-01 LAB — CBC
HCT: 24.7 % — ABNORMAL LOW (ref 39.0–52.0)
Hemoglobin: 7.8 g/dL — ABNORMAL LOW (ref 13.0–17.0)
MCH: 30.6 pg (ref 26.0–34.0)
MCHC: 31.6 g/dL (ref 30.0–36.0)
MCV: 96.9 fL (ref 78.0–100.0)
Platelets: 49 10*3/uL — ABNORMAL LOW (ref 150–400)
RBC: 2.55 MIL/uL — ABNORMAL LOW (ref 4.22–5.81)
RDW: 20 % — AB (ref 11.5–15.5)
WBC: 8.1 10*3/uL (ref 4.0–10.5)

## 2014-08-01 LAB — BASIC METABOLIC PANEL
Anion gap: 3 — ABNORMAL LOW (ref 5–15)
BUN: 44 mg/dL — AB (ref 6–20)
CO2: 22 mmol/L (ref 22–32)
Calcium: 8.5 mg/dL — ABNORMAL LOW (ref 8.9–10.3)
Chloride: 114 mmol/L — ABNORMAL HIGH (ref 101–111)
Creatinine, Ser: 0.51 mg/dL — ABNORMAL LOW (ref 0.61–1.24)
GFR calc Af Amer: >60 mL/min (ref >60)
GFR calc non Af Amer: >60 mL/min (ref >60)
GLUCOSE: 91 mg/dL (ref 65–99)
Potassium: 4.8 mmol/L (ref 3.5–5.1)
Sodium: 139 mmol/L (ref 135–145)

## 2014-08-01 LAB — CULTURE, BLOOD (ROUTINE X 2)

## 2014-08-01 LAB — GLUCOSE, CAPILLARY
GLUCOSE-CAPILLARY: 71 mg/dL (ref 65–99)
GLUCOSE-CAPILLARY: 75 mg/dL (ref 65–99)
GLUCOSE-CAPILLARY: 94 mg/dL (ref 65–99)
Glucose-Capillary: 68 mg/dL (ref 65–99)
Glucose-Capillary: 67 mg/dL (ref 65–99)
Glucose-Capillary: 99 mg/dL (ref 65–99)
Glucose-Capillary: 81 mg/dL (ref 65–99)
Glucose-Capillary: 68 mg/dL (ref 65–99)
Glucose-Capillary: 68 mg/dL (ref 65–99)

## 2014-08-01 MED ORDER — DEXTROSE 50 % IV SOLN
INTRAVENOUS | Status: AC
  Administered 2014-08-01: 25 mL
  Filled 2014-08-01: qty 50

## 2014-08-01 MED ORDER — INSULIN GLARGINE 100 UNIT/ML ~~LOC~~ SOLN
12.0000 [IU] | Freq: Every day | SUBCUTANEOUS | Status: DC
Start: 2014-08-01 — End: 2014-08-08
  Administered 2014-08-01 – 2014-08-07 (×7): 12 [IU] via SUBCUTANEOUS
  Filled 2014-08-01 (×8): qty 0.12

## 2014-08-01 MED ORDER — DEXTROSE 50 % IV SOLN
INTRAVENOUS | Status: AC
  Administered 2014-08-01: 50 mL via INTRAVENOUS
  Filled 2014-08-01: qty 50

## 2014-08-01 MED ORDER — DEXTROSE 50 % IV SOLN
1.0000 | Freq: Once | INTRAVENOUS | Status: AC
  Administered 2014-08-01: 50 mL via INTRAVENOUS
  Filled 2014-08-01: qty 50

## 2014-08-01 NOTE — Progress Notes (Signed)
Inpatient Diabetes Program Recommendations  AACE/ADA: New Consensus Statement on Inpatient Glycemic Control (2013)  Target Ranges:  Prepandial:   less than 140 mg/dL      Peak postprandial:   less than 180 mg/dL (1-2 hours)      Critically ill patients:  140 - 180 mg/dL   Results for Jason Becker, Jason Becker (MRN [id number]) as of 08/01/2014 07:51  Ref. Range 07/31/2014 07:45 07/31/2014 11:44 07/31/2014 15:56 07/31/2014 20:15 08/01/2014 00:20 08/01/2014 05:13 08/01/2014 05:55  Glucose-Capillary Latest Ref Range: 65-99 mg/dL 409142 (H) 811110 (H) 94 80 94 68 67   Current orders for Inpatient glycemic control: Lantus 25 QHS, Novolog 0-15 units Q4H  Inpatient Diabetes Program Recommendations Insulin - Basal: Noted fasting glucose this morning was 67 mg/dl and patient is no longer receiving steroids. Please consider decreasing Lantus to 20 units QHS.  Thanks, Orlando PennerMarie Adriana Quinby, RN, MSN, CCRN, CDE Diabetes Coordinator Inpatient Diabetes Program [phone] (Team Pager from 8am to 5pm) [phone] ([facility] office) [phone] [facility] office) [phone] [facility] office)

## 2014-08-01 NOTE — Care Management (Signed)
Important Message  Patient Details  Name: Jason Becker MRN: [id number] Date of Birth: 22-Aug-1930   Medicare Important Message Given:  Yes-second notification given    Magdalene RiverMayo, Bibi Economos T, RN 08/01/2014, 7:28 AM

## 2014-08-01 NOTE — Progress Notes (Signed)
[facility] TEAM 1 - Stepdown/ICU TEAM Progress Note  Jason Becker RUE:[id number] DOB: December 09, 1930 DOA: 07/26/2014 PCP: Hillary Bow, MD  Admit HPI / Brief Narrative: 68 WM  who lived in [facility] PMHx Anxiety, Advanced dementia, Cardiac Arrest, HTN, atrial fibrillation, Acute and chronic respiratory failure (acute-on-chronic);  who has been 100% vent dependent for approx one year with a complicated course over that time including at least one cardiac arrest, decubitus pressure ulcers, infections with resistant organisms (UTI, PNA), and recurrent anemia. He was initially intubated for aspiration PNA and underwent trach placement for recurrence. Prior to his initial hospitalization one year ago, his dementia had advanced to the point where he required assistance with feeding and his daughter indicated that his intellect/functional status at that point was equivalent to that of a [age] yr old. Routine weekly labs on the day of admission noted a Hgb of 4.8 and Cr 1.8 prompting transfer to [facility] ED where he was initially treated as a code sepsis. A unit of PRBCs was transfused en route to the ED and two more units ordered in the ED.  HPI/Subjective: 6/28 catatonic, patient's eyes spontaneously open response to no stimuli.  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) -Per PCCM "at this point there is no realistic hope for liberation from the vent" -no attempts to be made to wean  Chronic anemia w/ severe acute anemia  -Hemoglobin stable -Per PCCM "continued transfusion would be futile"   Severe sepsis / septic shock - Proteus UTI -Should be appropriately cover with current antibiotics; complete a 10 day tx course   H/O cardiac arrest  - AICD present  Parox Afib  -Normal sinus rhythm presently   Hypovolemia / Hypotension  -Per PCCM "pressors off, no restart, would be futile, will not offer"  - blood pressure stable at this time  Acute kidney injury on CKD -Not a HD candidate per PCCM -  renal function is actually normal presently with GFR >60 - BUN continues to decline - follow trend   Severe hypernatremia -Resolved with free water administration  Multiple decubitus ulcers -Most likely will never heal given patient's albumin of <1  -Continue best efforts to treat/avoid progression  ?contaminated blood cx - gram+ cocci clusters 1/2 -Speciation confirms coag-negative staph suggestive of contaminant - discontinued vancomycin  H/O MRSA, pseudomonas, ESBL E coli  Hypothyroidism -Continue Synthroid therapy  Hyperglycemia -Follow with sliding scale insulin - reasonably controlled at present  Severe hypokalemia  -Continue Lantus 25 units QHS -Continue moderate SSI  Advanced dementia  Very severe, irreversible debilitation   Code Status: FULL Family Communication: no family present at time of exam Disposition Plan: patient is been turned down by 4 LTAC's; will begin searching for vent SNF  Consultants:  Dr.David B Simonds PCCM  Dr.Aaron J Lampkin.Palliative Care Ethics Committee   Procedure/Significant Events:    Culture   Antibiotics: Vanc 6/22 > 6/26 Aztreonam 6/22 >  DVT prophylaxis: SCDs    Devices    LINES / TUBES:      Continuous Infusions: . sodium chloride 75 mL/hr at 07/31/14 1738  . feeding supplement (VITAL AF 1.2 CAL) 1,000 mL (07/31/14 1400)    Objective: VITAL SIGNS: Temp: 98.1 F (36.7 C) (06/28 1204) Temp Source: Axillary (06/28 1204) BP: 108/49 mmHg (06/28 1232) Pulse Rate: 72 (06/28 1232) SPO2; FIO2:   Intake/Output Summary (Last 24 hours) at 08/01/14 1351 Last data filed at 08/01/14 1200  Gross per 24 hour  Intake 3895.83 ml  Output   2860 ml  Net 1035.83 ml     Exam: General:  catatonic, NAD, No acute respiratory distress Eyes: Eyes spontaneously open,  negative blink response, negative retinal hemorrhage ENT: Negative Runny nose, n Neck:  Negative scars, masses, torticollis, lymphadenopathy,  JVD, chronic trach in place negative sign of infection  Lungs:  coarse breath sounds diffusely, without wheezes or crackles Cardiovascular: Regular rate and rhythm without murmur gallop or rub normal S1 and S2 Abdomen:negative abdominal pain, negative dysphagia, Nontender, nondistended, soft, bowel sounds positive, no rebound, no ascites, no appreciable mass, PEG tube in place negative sign of infection  Extremities: No significant cyanosis, clubbing, or edema bilateral lower extremities Skin multiple skin lacerations on bilateral arms/legs secondary to poor nutrition; diffuse swelling all extremities causing breakdown in skin.   Neurologic:   does not respond to painful stimuli.     Data Reviewed: Basic Metabolic Panel:  Recent Labs Lab 07/28/14 0219 07/29/14 0810 07/30/14 0528 07/31/14 0500 08/01/14 0046  NA 151* 142 141 141 139  K 3.9 2.8* 3.1* 4.0 4.8  CL 121* 116* 115* 116* 114*  CO2 21* 22  GLUCOSE 201* 203* 187* 173* 91  BUN 95* 80* 69* 57* 44*  CREATININE 1.14 0.86 0.72 0.57* 0.51*  CALCIUM 10.0 9.0 8.8* 8.6* 8.5*  MG  --   --  1.8  --   --    Liver Function Tests:  Recent Labs Lab 07/26/14 1637 07/30/14 0528  AST 13* 13*  ALT 12* 11*  ALKPHOS 129* 82  BILITOT 0.4 0.3  PROT 6.5 5.7*  ALBUMIN 1.0* <1.0*   No results for input(s): LIPASE, AMYLASE in the last 168 hours. No results for input(s): AMMONIA in the last 168 hours. CBC:  Recent Labs Lab 07/26/14 1637  07/28/14 0219 07/29/14 0810 07/30/14 0528 07/31/14 0500 08/01/14 0046  WBC 6.1  < > 4.3 4.7 4.9 4.7 8.1  NEUTROABS 4.9  --   --   --   --   --   --   HGB 4.8*  < > 7.3* 6.9* 7.3* 7.2* 7.8*  HCT 16.9*  < > 23.6* 22.1* 22.9* 23.4* 24.7*  MCV 109.7*  < > 98.7 96.9 95.4 97.5 96.9  PLT 56*  < > 46* 45* 46* 47* 49*  < > = values in this interval not displayed. Cardiac Enzymes: No results for input(s): CKTOTAL, CKMB, CKMBINDEX, TROPONINI in the last 168 hours. BNP (last 3 results) No  results for input(s): BNP in the last 8760 hours.  ProBNP (last 3 results) No results for input(s): PROBNP in the last 8760 hours.  CBG:  Recent Labs Lab 08/01/14 0513 08/01/14 0555 08/01/14 0628 08/01/14 0822 08/01/14 1203  GLUCAP 68 67 99 71 81    Recent Results (from the past 240 hour(s))  Culture, blood (routine x 2)     Status: None   Collection Time: 07/26/14  4:35 PM  Result Value Ref Range Status   Specimen Description BLOOD RIGHT ARM  Final   Special Requests BOTTLES DRAWN AEROBIC AND ANAEROBIC 10CC  Final   Culture NO GROWTH 5 DAYS  Final   Report Status 07/31/2014 FINAL  Final  Culture, blood (routine x 2)     Status: None   Collection Time: 07/26/14  4:42 PM  Result Value Ref Range Status   Specimen Description BLOOD RIGHT HAND  Final   Special Requests BOTTLES DRAWN AEROBIC ONLY 10CC  Final   Culture  Setup Time   Final    GRAM  POSITIVE COCCI IN CLUSTERS AEROBIC BOTTLE ONLY CRITICAL RESULT CALLED TO, READ BACK BY AND VERIFIED WITH: MHANCOCK,RN BY TCLEVELAND AT 3:08AM 07/28/14 CONFIRMED BY KBARR,MT    Culture   Final    STAPHYLOCOCCUS SPECIES (COAGULASE NEGATIVE) THE SIGNIFICANCE OF ISOLATING THIS ORGANISM FROM A SINGLE SET OF BLOOD CULTURES WHEN MULTIPLE SETS ARE DRAWN IS UNCERTAIN. PLEASE NOTIFY THE MICROBIOLOGY DEPARTMENT WITHIN ONE WEEK IF SPECIATION AND SENSITIVITIES ARE REQUIRED.    Report Status 08/01/2014 FINAL  Final  Urine culture     Status: None   Collection Time: 07/26/14  5:07 PM  Result Value Ref Range Status   Specimen Description URINE, RANDOM  Final   Special Requests NONE  Final   Culture >=100,000 COLONIES/mL PROTEUS MIRABILIS  Final   Report Status 07/28/2014 FINAL  Final   Organism ID, Bacteria PROTEUS MIRABILIS  Final      Susceptibility   Proteus mirabilis - MIC*    AMPICILLIN <=2 SENSITIVE Sensitive     CEFAZOLIN <=4 SENSITIVE Sensitive     CEFTRIAXONE <=1 SENSITIVE Sensitive     CIPROFLOXACIN >=4 RESISTANT Resistant      GENTAMICIN <=1 SENSITIVE Sensitive     IMIPENEM 1 SENSITIVE Sensitive     NITROFURANTOIN 128 RESISTANT Resistant     TRIMETH/SULFA <=20 SENSITIVE Sensitive     AMPICILLIN/SULBACTAM <=2 SENSITIVE Sensitive     PIP/TAZO <=4 SENSITIVE Sensitive     * >=100,000 COLONIES/mL PROTEUS MIRABILIS  MRSA PCR Screening     Status: None   Collection Time: 07/26/14  9:02 PM  Result Value Ref Range Status   MRSA by PCR NEGATIVE NEGATIVE Final    Comment:        The GeneXpert MRSA Assay (FDA approved for NASAL specimens only), is one component of a comprehensive MRSA colonization surveillance program. It is not intended to diagnose MRSA infection nor to guide or monitor treatment for MRSA infections.      Studies:  Recent x-ray studies have been reviewed in detail by the Attending Physician  Scheduled Meds:  Scheduled Meds: . alteplase  2 mg Intracatheter Once  . amiodarone  200 mg Per Tube Daily  . antiseptic oral rinse  7 mL Mouth Rinse QID  . aztreonam  1 g Intravenous Q8H  . chlorhexidine  15 mL Mouth Rinse BID  . feeding supplement (PRO-STAT SUGAR FREE 64)  30 mL Per Tube BID  . folic acid  1 mg Per Tube Daily  . free water  200 mL Per Tube Q4H  . insulin aspart  0-15 Units Subcutaneous 6 times per day  . insulin aspart  10 Units Intravenous Once  . insulin glargine  25 Units Subcutaneous QHS  . levothyroxine  75 mcg Per Tube QAC breakfast  . pantoprazole sodium  40 mg Per Tube Q1200  . potassium chloride  40 mEq Per Tube TID  . sennosides  10 mL Per Tube BID  . Valproic Acid  500 mg Per Tube 3 times per day    Time spent on care of this patient: 40 mins   WOODS, Roselind MessierURTIS J , MD  [facility] Office  [phone] Pager - [phone]  On-Call/Text Page:      [email or web address]      password [email or web address]  If 7PM-7AM, please contact night-coverage [email or web address] Password [email or web address] 08/01/2014, 1:51 PM   LOS: 6 days   Care during the described time interval was provided by me .  I  have reviewed this patient's available data, including medical history, events  of note, physical examination, and all test results as part of my evaluation. I have personally reviewed and interpreted all radiology studies.   Dia Crawford, MD [phone] Pager

## 2014-08-01 NOTE — Progress Notes (Addendum)
  Per RNCM no LTAC bed offers available- RNCM spoke with pt son and explained that there were no bed offers for pt- pt son is interested in pursuing other facility options for SNF- CSW initiated bed search  [facility] [place], [place]  [facility], [place], [place]- [phone]-pt came from [facility], family would like pt to go to alternate facility- called on 6/28 to see if pt room is available in case family changes their mind  [facility], [place], [place] [phone]- spoke with facility on 6/27 they don't have male beds available at this time- sent them an official referral on 6/28  [facility], [place], [place]- [phone]- referral sent- they are reviewing clinicals and hope to give an answer by 5pm on 6/29- they did confirm they have beds available  [facility], [place], [place]- [phone]-sent referral- they are reviewing patient info  [facility], [place], [place]- [phone]- sent referral  [facility], [place], [place] [phone]- sent referral  CSW will continue to follow.  Merlyn LotJenna Holoman, LCSWA Clinical Social Worker [phone]  PASAR # [id number]

## 2014-08-01 NOTE — Progress Notes (Addendum)
Hypoglycemic Event  CBG: 67  Treatment: D50 IV 25 mL  Symptoms: None  Follow-up CBG: Time: 6:31 AM  CBG Result: 99  Possible Reasons for Event: Inadequate meal intake  Comments/MD notified:NP notified     Jennye Boroughsngram, Kaleeyah Cuffie M  Remember to initiate Hypoglycemia Order Set & complete

## 2014-08-02 LAB — GLUCOSE, CAPILLARY
GLUCOSE-CAPILLARY: 104 mg/dL — AB (ref 65–99)
GLUCOSE-CAPILLARY: 101 mg/dL — AB (ref 65–99)
Glucose-Capillary: 103 mg/dL — ABNORMAL HIGH (ref 65–99)
Glucose-Capillary: 103 mg/dL — ABNORMAL HIGH (ref 65–99)
Glucose-Capillary: 113 mg/dL — ABNORMAL HIGH (ref 65–99)
Glucose-Capillary: 122 mg/dL — ABNORMAL HIGH (ref 65–99)

## 2014-08-02 MED ORDER — FREE WATER
200.0000 mL | Freq: Four times a day (QID) | Status: DC
  Administered 2014-08-02 – 2014-08-08 (×23): 200 mL

## 2014-08-02 NOTE — Progress Notes (Signed)
Trach Team  Patient Details Name: Jason Becker MRN: [id number] DOB: 1930-02-10 Today's Date: 08/02/2014 Time:  -     Discussed pt status with RN and reviewed chart. Pt 100% vent dependent and in a catatonic state (per chart review). Not appropriate for ST intervention at this time.    Breck CoonsLisa Willis WormleysburgLitaker M.Ed [employer]-SLP Pager [phone]

## 2014-08-02 NOTE — Progress Notes (Signed)
[place] TEAM 1 - Stepdown/ICU TEAM Progress Note  Jason Becker WUJ:[id number] DOB: 04-06-1930 DOA: 07/26/2014 PCP: Hillary Bow, MD  Admit HPI / Brief Narrative: 20 M who lives in [facility] with advanced dementia who has been 100% vent dependent for approx one year with a complicated course over that time including at least one cardiac arrest, decubitus pressure ulcers, infections with resistant organisms (UTI, PNA), and recurrent anemia. He was initially intubated for aspiration PNA and underwent trach placement for recurrence. Prior to his initial hospitalization one year ago, his dementia had advanced to the point where he required assistance with feeding and his daughter indicated that his intellect/functional status at that point was equivalent to that of a [age] yr old. Routine weekly labs on the day of admission noted a Hgb of 4.8 and Cr 1.8 prompting transfer to [facility] ED where he was initially treated as a code sepsis. A unit of PRBCs was transfused en route to the ED and two more units ordered in the ED.  HPI/Subjective: Patient remains noncommunicative.  There is no family present at time of my visit.  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) Per PCCM "at this point there is no realistic hope for liberation from the vent" - no attempts to be made to wean  Chronic anemia w/ severe acute anemia  Per PCCM "continued transfusion would be futile" - Hgb stable   Severe sepsis / septic shock - Proteus UTI Should be appropriately cover with current antibiotics - plan to complete a 10 day tx course and follow   H/O cardiac arrest - AICD present  Parox Afib  Normal sinus rhythm presently   Hypovolemia / Hypotension  Per PCCM "pressors off, no restart, would be futile, will not offer" - blood pressure stable  Acute kidney injury on CKD Not a HD candidate per PCCM - renal function has normalized with GFR >60  Severe hypernatremia Free water deficit - resolved with free water  administration  Multiple decubitus ulcers Continue best efforts to treat/avoid progression  ?contaminated blood cx - gram+ cocci clusters 1/2 Speciation confirms coag-negative staph suggestive of contaminant - discontinued vancomycin  H/O MRSA, pseudomonas, ESBL E coli  Hypothyroidism Continue Synthroid therapy  Hyperglycemia Follow with sliding scale insulin - reasonably controlled at present  Severe hypokalemia   Corrected to normal   Very advanced dementia  Very severe, irreversible debilitation  Code Status: FULL Family Communication: no family present at time of exam Disposition Plan: SDU - medically ready for disposition once abx course completed    Consultants: PCCM Palliative Care Ethics Committee   Antibiotics: Vanc 6/22 > 6/26 Aztreonam 6/22 >  DVT prophylaxis: SCDs  Objective: Blood pressure 131/61, pulse 72, temperature 97 F (36.1 C), temperature source Axillary, resp. rate 26, height  (1.854 m), weight 72.7 kg (160 lb 4.4 oz), SpO2 100 %.  Intake/Output Summary (Last 24 hours) at 08/02/14 1421 Last data filed at 08/02/14 1300  Gross per 24 hour  Intake   4350 ml  Output   2250 ml  Net   2100 ml   Exam: General: No acute respiratory distress Lungs: No focal crackles or wheezes appreciable  Cardiovascular: Regular rate and rhythm without murmur   Data Reviewed: Basic Metabolic Panel:  Recent Labs Lab 07/28/14 0219 07/29/14 0810 07/30/14 0528 07/31/14 0500 08/01/14 0046  NA 151* 142 141 141 139  K 3.9 2.8* 3.1* 4.0 4.8  CL 121* 116* 115* 116* 114*  CO2 21* 22  GLUCOSE 201* 203* 187* 173* 91  BUN 95* 80* 69* 57* 44*  CREATININE 1.14 0.86 0.72 0.57* 0.51*  CALCIUM 10.0 9.0 8.8* 8.6* 8.5*  MG  --   --  1.8  --   --     CBC:  Recent Labs Lab 07/26/14 1637  07/28/14 0219 07/29/14 0810 07/30/14 0528 07/31/14 0500 08/01/14 0046  WBC 6.1  < > 4.3 4.7 4.9 4.7 8.1  NEUTROABS 4.9  --   --   --   --   --   --   HGB  4.8*  < > 7.3* 6.9* 7.3* 7.2* 7.8*  HCT 16.9*  < > 23.6* 22.1* 22.9* 23.4* 24.7*  MCV 109.7*  < > 98.7 96.9 95.4 97.5 96.9  PLT 56*  < > 46* 45* 46* 47* 49*  < > = values in this interval not displayed.  Liver Function Tests:  Recent Labs Lab 07/26/14 1637 07/30/14 0528  AST 13* 13*  ALT 12* 11*  ALKPHOS 129* 82  BILITOT 0.4 0.3  PROT 6.5 5.7*  ALBUMIN 1.0* <1.0*   Coags:  Recent Labs Lab 07/27/14 0534 07/28/14 0219  INR 1.77* 1.75*    Recent Labs Lab 07/27/14 0534  APTT 21*   CBG:  Recent Labs Lab 08/01/14 2125 08/01/14 2358 08/02/14 0404 08/02/14 0744 08/02/14 1213  GLUCAP 68 103* 103* 104* 122*    Recent Results (from the past 240 hour(s))  Culture, blood (routine x 2)     Status: None   Collection Time: 07/26/14  4:35 PM  Result Value Ref Range Status   Specimen Description BLOOD RIGHT ARM  Final   Special Requests BOTTLES DRAWN AEROBIC AND ANAEROBIC 10CC  Final   Culture NO GROWTH 5 DAYS  Final   Report Status 07/31/2014 FINAL  Final  Culture, blood (routine x 2)     Status: None   Collection Time: 07/26/14  4:42 PM  Result Value Ref Range Status   Specimen Description BLOOD RIGHT HAND  Final   Special Requests BOTTLES DRAWN AEROBIC ONLY 10CC  Final   Culture  Setup Time   Final    GRAM POSITIVE COCCI IN CLUSTERS AEROBIC BOTTLE ONLY CRITICAL RESULT CALLED TO, READ BACK BY AND VERIFIED WITH: MHANCOCK,RN BY TCLEVELAND AT 3:08AM 07/28/14 CONFIRMED BY KBARR,MT    Culture   Final    STAPHYLOCOCCUS SPECIES (COAGULASE NEGATIVE) THE SIGNIFICANCE OF ISOLATING THIS ORGANISM FROM A SINGLE SET OF BLOOD CULTURES WHEN MULTIPLE SETS ARE DRAWN IS UNCERTAIN. PLEASE NOTIFY THE MICROBIOLOGY DEPARTMENT WITHIN ONE WEEK IF SPECIATION AND SENSITIVITIES ARE REQUIRED.    Report Status 08/01/2014 FINAL  Final  Urine culture     Status: None   Collection Time: 07/26/14  5:07 PM  Result Value Ref Range Status   Specimen Description URINE, RANDOM  Final   Special  Requests NONE  Final   Culture >=100,000 COLONIES/mL PROTEUS MIRABILIS  Final   Report Status 07/28/2014 FINAL  Final   Organism ID, Bacteria PROTEUS MIRABILIS  Final      Susceptibility   Proteus mirabilis - MIC*    AMPICILLIN <=2 SENSITIVE Sensitive     CEFAZOLIN <=4 SENSITIVE Sensitive     CEFTRIAXONE <=1 SENSITIVE Sensitive     CIPROFLOXACIN >=4 RESISTANT Resistant     GENTAMICIN <=1 SENSITIVE Sensitive     IMIPENEM 1 SENSITIVE Sensitive     NITROFURANTOIN 128 RESISTANT Resistant     TRIMETH/SULFA <=20 SENSITIVE Sensitive     AMPICILLIN/SULBACTAM <=2 SENSITIVE Sensitive  PIP/TAZO <=4 SENSITIVE Sensitive     * >=100,000 COLONIES/mL PROTEUS MIRABILIS  MRSA PCR Screening     Status: None   Collection Time: 07/26/14  9:02 PM  Result Value Ref Range Status   MRSA by PCR NEGATIVE NEGATIVE Final    Comment:        The GeneXpert MRSA Assay (FDA approved for NASAL specimens only), is one component of a comprehensive MRSA colonization surveillance program. It is not intended to diagnose MRSA infection nor to guide or monitor treatment for MRSA infections.      Studies:   Recent x-ray studies have been reviewed in detail by the Attending Physician  Scheduled Meds:  Scheduled Meds: . alteplase  2 mg Intracatheter Once  . amiodarone  200 mg Per Tube Daily  . antiseptic oral rinse  7 mL Mouth Rinse QID  . aztreonam  1 g Intravenous Q8H  . chlorhexidine  15 mL Mouth Rinse BID  . feeding supplement (PRO-STAT SUGAR FREE 64)  30 mL Per Tube BID  . folic acid  1 mg Per Tube Daily  . free water  200 mL Per Tube Q4H  . insulin aspart  0-15 Units Subcutaneous 6 times per day  . insulin aspart  10 Units Intravenous Once  . insulin glargine  12 Units Subcutaneous QHS  . levothyroxine  75 mcg Per Tube QAC breakfast  . pantoprazole sodium  40 mg Per Tube Q1200  . potassium chloride  40 mEq Per Tube TID  . sennosides  10 mL Per Tube BID  . Valproic Acid  500 mg Per Tube 3 times  per day    Time spent on care of this patient: 15 mins   Rollin Kotowski T , MD   [facility] Office  [phone] Pager - Text Page per Loretha Stapler as per below:  On-Call/Text Page:      [email or web address]      password [email or web address]  If 7PM-7AM, please contact night-coverage [email or web address] Password [email or web address] 08/02/2014, 2:21 PM   LOS: 7 days

## 2014-08-03 LAB — GLUCOSE, CAPILLARY
Glucose-Capillary: 102 mg/dL — ABNORMAL HIGH (ref 65–99)
Glucose-Capillary: 102 mg/dL — ABNORMAL HIGH (ref 65–99)
Glucose-Capillary: 103 mg/dL — ABNORMAL HIGH (ref 65–99)
Glucose-Capillary: 108 mg/dL — ABNORMAL HIGH (ref 65–99)
Glucose-Capillary: 114 mg/dL — ABNORMAL HIGH (ref 65–99)
Glucose-Capillary: 97 mg/dL (ref 65–99)

## 2014-08-03 LAB — BASIC METABOLIC PANEL
ANION GAP: 5 (ref 5–15)
BUN: 27 mg/dL — AB (ref 6–20)
CALCIUM: 8.6 mg/dL — AB (ref 8.9–10.3)
CO2: 25 mmol/L (ref 22–32)
Chloride: 105 mmol/L (ref 101–111)
Creatinine, Ser: 0.47 mg/dL — ABNORMAL LOW (ref 0.61–1.24)
GLUCOSE: 118 mg/dL — AB (ref 65–99)
Potassium: 4.7 mmol/L (ref 3.5–5.1)
Sodium: 135 mmol/L (ref 135–145)

## 2014-08-03 LAB — CBC
HEMATOCRIT: 21.1 % — AB (ref 39.0–52.0)
HEMOGLOBIN: 6.8 g/dL — AB (ref 13.0–17.0)
MCH: 31 pg (ref 26.0–34.0)
MCHC: 31.8 g/dL (ref 30.0–36.0)
MCV: 97.7 fL (ref 78.0–100.0)
Platelets: 62 10*3/uL — ABNORMAL LOW (ref 150–400)
RBC: 2.16 MIL/uL — AB (ref 4.22–5.81)
RDW: 19.6 % — AB (ref 11.5–15.5)
WBC: 5.8 10*3/uL (ref 4.0–10.5)

## 2014-08-03 NOTE — Progress Notes (Addendum)
ANTIBIOTIC CONSULT NOTE - FOLLOW UP  Pharmacy Consult for Aztreonam Indication: sepsis  Allergies  Allergen Reactions  . Penicillins Anaphylaxis    Per wife- patient had rash and trouble breathing.   . Other     "Benzathine"  . Rocephin [Ceftriaxone Sodium In Dextrose]     Patient Measurements: Height: 6\' 1"  (185.4 cm) Weight: 160 lb 4.4 oz (72.7 kg) IBW/kg (Calculated) : 79.9  Vital Signs: Temp: 96.9 F (36.1 C) (06/30 0818) Temp Source: Axillary (06/30 0818) BP: 141/49 mmHg (06/30 0818) Pulse Rate: 78 (06/30 0818) Intake/Output from previous day: 06/29 0701 - 06/30 0700 In: 3487.9 [I.V.:1417.9; NG/GT:1920; IV Piggyback:150] Out: 4300 [Urine:4300] Intake/Output from this shift: Total I/O In: -  Out: 400 [Urine:400]  Labs:  Recent Labs  08/01/14 0046 08/03/14 0446  WBC 8.1 5.8  HGB 7.8* 6.8*  PLT 49* 62*  CREATININE 0.51* 0.47*   Estimated Creatinine Clearance: 74.5 mL/min (by C-G formula based on Cr of 0.47). No results for input(s): VANCOTROUGH, VANCOPEAK, VANCORANDOM, GENTTROUGH, GENTPEAK, GENTRANDOM, TOBRATROUGH, TOBRAPEAK, TOBRARND, AMIKACINPEAK, AMIKACINTROU, AMIKACIN in the last 72 hours.   Microbiology: Recent Results (from the past 720 hour(s))  Culture, blood (routine x 2)     Status: None   Collection Time: 07/26/14  4:35 PM  Result Value Ref Range Status   Specimen Description BLOOD RIGHT ARM  Final   Special Requests BOTTLES DRAWN AEROBIC AND ANAEROBIC 10CC  Final   Culture NO GROWTH 5 DAYS  Final   Report Status 07/31/2014 FINAL  Final  Culture, blood (routine x 2)     Status: None   Collection Time: 07/26/14  4:42 PM  Result Value Ref Range Status   Specimen Description BLOOD RIGHT HAND  Final   Special Requests BOTTLES DRAWN AEROBIC ONLY 10CC  Final   Culture  Setup Time   Final    GRAM POSITIVE COCCI IN CLUSTERS AEROBIC BOTTLE ONLY CRITICAL RESULT CALLED TO, READ BACK BY AND VERIFIED WITH: MHANCOCK,RN BY TCLEVELAND AT 3:08AM  07/28/14 CONFIRMED BY KBARR,MT    Culture   Final    STAPHYLOCOCCUS SPECIES (COAGULASE NEGATIVE) THE SIGNIFICANCE OF ISOLATING THIS ORGANISM FROM A SINGLE SET OF BLOOD CULTURES WHEN MULTIPLE SETS ARE DRAWN IS UNCERTAIN. PLEASE NOTIFY THE MICROBIOLOGY DEPARTMENT WITHIN ONE WEEK IF SPECIATION AND SENSITIVITIES ARE REQUIRED.    Report Status 08/01/2014 FINAL  Final  Urine culture     Status: None   Collection Time: 07/26/14  5:07 PM  Result Value Ref Range Status   Specimen Description URINE, RANDOM  Final   Special Requests NONE  Final   Culture >=100,000 COLONIES/mL PROTEUS MIRABILIS  Final   Report Status 07/28/2014 FINAL  Final   Organism ID, Bacteria PROTEUS MIRABILIS  Final      Susceptibility   Proteus mirabilis - MIC*    AMPICILLIN <=2 SENSITIVE Sensitive     CEFAZOLIN <=4 SENSITIVE Sensitive     CEFTRIAXONE <=1 SENSITIVE Sensitive     CIPROFLOXACIN >=4 RESISTANT Resistant     GENTAMICIN <=1 SENSITIVE Sensitive     IMIPENEM 1 SENSITIVE Sensitive     NITROFURANTOIN 128 RESISTANT Resistant     TRIMETH/SULFA <=20 SENSITIVE Sensitive     AMPICILLIN/SULBACTAM <=2 SENSITIVE Sensitive     PIP/TAZO <=4 SENSITIVE Sensitive     * >=100,000 COLONIES/mL PROTEUS MIRABILIS  MRSA PCR Screening     Status: None   Collection Time: 07/26/14  9:02 PM  Result Value Ref Range Status   MRSA by PCR NEGATIVE NEGATIVE  Final    Comment:        The GeneXpert MRSA Assay (FDA approved for NASAL specimens only), is one component of a comprehensive MRSA colonization surveillance program. It is not intended to diagnose MRSA infection nor to guide or monitor treatment for MRSA infections.     Anti-infectives    Start     Dose/Rate Route Frequency Ordered Stop   07/28/14 1600  levofloxacin (LEVAQUIN) IVPB 750 mg  Status:  Discontinued     750 mg 100 mL/hr over 90 Minutes Intravenous Every 48 hours 07/26/14 2032 07/28/14 1317   07/28/14 1330  vancomycin (VANCOCIN) 500 mg in sodium chloride 0.9 %  100 mL IVPB  Status:  Discontinued     500 mg 100 mL/hr over 60 Minutes Intravenous Every 12 hours 07/28/14 1328 07/30/14 1812   07/27/14 1800  vancomycin (VANCOCIN) IVPB 1000 mg/200 mL premix  Status:  Discontinued     1,000 mg 200 mL/hr over 60 Minutes Intravenous Every 24 hours 07/26/14 2032 07/28/14 1328   07/27/14 0100  aztreonam (AZACTAM) 1 g in dextrose 5 % 50 mL IVPB     1 g 100 mL/hr over 30 Minutes Intravenous Every 8 hours 07/26/14 2032     07/26/14 1700  vancomycin (VANCOCIN) 1,250 mg in sodium chloride 0.9 % 250 mL IVPB     1,250 mg 166.7 mL/hr over 90 Minutes Intravenous  Once 07/26/14 1650 07/26/14 1931   07/26/14 1645  levofloxacin (LEVAQUIN) IVPB 750 mg     750 mg 100 mL/hr over 90 Minutes Intravenous  Once 07/26/14 1633 07/26/14 2008   07/26/14 1645  aztreonam (AZACTAM) 2 g in dextrose 5 % 50 mL IVPB     2 g 100 mL/hr over 30 Minutes Intravenous  Once 07/26/14 1633 07/26/14 1752   07/26/14 1645  vancomycin (VANCOCIN) IVPB 1000 mg/200 mL premix  Status:  Discontinued     1,000 mg 200 mL/hr over 60 Minutes Intravenous  Once 07/26/14 1633 07/26/14 1650      Assessment: 83 yom chronic trach-vent dependent from [facility]. He continues on aztreonam D#9 for sepsis. He is afebrile, and WBC is WNL. Scr is good at 0.47. Per notes, planning a 10-day course but no end date entered yet.   6/22 vanc>>6/26 6/22 levo>>6/24 6/22 aztreonam>>  6/22 BCx2 - CoNS 6/22 UC- 100K Proteus (R-FQ/Nitrof; S-PCN/Ceph, Imi, Bactrim) 6/22 MRSA pcr negative  Goal of Therapy:  Eradication of infection  Plan:  - Continue aztreonam 1gm IV Q8H - Consider adding a stop date - F/u renal fxn, C&S, clinical status  Lysle Pearl, PharmD, BCPS Pager # [phone] 08/03/2014 9:40 AM  Stop date of after 7/1 doses entered for azactam which will complete a 10 day course of therapy.  Pharmacy will sign off.   Isaac Bliss, PharmD, BCPS Clinical Pharmacist Pager [phone] 08/03/2014 10:42  AM

## 2014-08-03 NOTE — Progress Notes (Signed)
[place] TEAM 1 - Stepdown/ICU TEAM Progress Note  Jason Becker ZOX:[id number] DOB: 1930/11/30 DOA: 07/26/2014 PCP: Hillary Bow, MD  Admit HPI / Brief Narrative: 58 WM  who lived in [facility] PMHx Anxiety, Advanced dementia, Cardiac Arrest, HTN, atrial fibrillation, Acute and chronic respiratory failure (acute-on-chronic);  who has been 100% vent dependent for approx one year with a complicated course over that time including at least one cardiac arrest, decubitus pressure ulcers, infections with resistant organisms (UTI, PNA), and recurrent anemia. He was initially intubated for aspiration PNA and underwent trach placement for recurrence. Prior to his initial hospitalization one year ago, his dementia had advanced to the point where he required assistance with feeding and his daughter indicated that his intellect/functional status at that point was equivalent to that of a [age] yr old. Routine weekly labs on the day of admission noted a Hgb of 4.8 and Cr 1.8 prompting transfer to [facility] ED where he was initially treated as a code sepsis. A unit of PRBCs was transfused en route to the ED and two more units ordered in the ED.  HPI/Subjective: 6/30 catatonic, patient's eyes spontaneously open negative response to no stimuli.   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 -Per PCCM "continued transfusion would be futile" I concur with PCCM's evaluation of patient, therefore would not offer transfusion.  Severe sepsis / septic shock - Proteus UTI -Should be appropriately cover with current antibiotics; complete a 10 day tx course   H/O cardiac arrest  - AICD present  Parox Afib  -Normal sinus rhythm presently   Hypovolemia / Hypotension  -Per PCCM "pressors off, no restart, would be futile, will not offer"  -  blood pressure stable at this time  Acute kidney injury on CKD -Not a HD candidate per PCCM - renal function is actually normal presently with GFR >60  - BUN trending down.    Severe hypernatremia -Resolved with free water administration  Multiple decubitus ulcers -Most likely will never heal given patient's albumin  <1  -Continue best efforts to treat/avoid progression  ?contaminated blood cx - gram+ cocci clusters 1/2 -Speciation confirms coag-negative staph suggestive of contaminant - discontinued vancomycin  H/O MRSA, pseudomonas, ESBL E coli  Hypothyroidism -Continue Synthroid therapy  Hyperglycemia -Continue Lantus 12 units QHS -Continue moderate SSI  Severe hypokalemia  -Within normal limit continue to monitor  Advanced dementia -Patient currently catatonic  Very severe, irreversible debilitation     Code Status: FULL Family Communication: no family present at time of exam Disposition Plan: patient is been turned down by 4 LTAC's; will begin searching for vent SNF  Consultants:  Dr.David B Simonds PCCM  Dr.Aaron J Lampkin.Palliative Care Ethics Committee   Procedure/Significant Events:    Culture   Antibiotics: Vanc 6/22 > 6/26 Aztreonam 6/22 >  DVT prophylaxis: SCDs    Devices    LINES / TUBES:      Continuous Infusions: . sodium chloride 50 mL/hr at 08/02/14 1543  . feeding supplement (VITAL AF 1.2 CAL) 1,000 mL (08/03/14 0146)    Objective: VITAL SIGNS: Temp: 98.9 F (37.2 C) (06/30 0035) Temp Source: Axillary (06/29 2106) BP: 125/50 mmHg (06/30 0500) Pulse Rate: 72 (06/30 0500) SPO2; FIO2:   Intake/Output Summary (Last 24 hours) at 08/03/14 0809 Last data filed at 08/03/14 0700  Gross per 24 hour  Intake 2957.92 ml  Output   4300 ml  Net -1342.08 ml     Exam: General:  catatonic, NAD, No acute respiratory distress Eyes: Eyes spontaneously open,  negative blink response, negative retinal hemorrhage ENT:  Negative Runny nose,  Neck:  Negative scars, masses, torticollis, lymphadenopathy, JVD, chronic trach in place negative sign of infection  Lungs:  coarse breath sounds diffusely, without wheezes or crackles Cardiovascular: Regular rate and rhythm without murmur gallop or rub normal S1 and S2 Abdomen:negative abdominal pain, negative dysphagia, Nontender, nondistended, soft, bowel sounds positive, no rebound, no ascites, no appreciable mass, PEG tube in place negative sign of infection  Extremities: No significant cyanosis, clubbing, or edema bilateral lower extremities Skin multiple skin lacerations on bilateral arms/legs secondary to poor nutrition; diffuse swelling all extremities causing breakdown in skin.   Neurologic:   does not respond to painful stimuli.     Data Reviewed: Basic Metabolic Panel:  Recent Labs Lab 07/29/14 0810 07/30/14 0528 07/31/14 0500 08/01/14 0046 08/03/14 0446  NA 142 141 141 139 135  K 2.8* 3.1* 4.0 4.8 4.7  CL 116* 115* 116* 114* 105  CO2 22 22 21* 22 25  GLUCOSE 203* 187* 173* 91 118*  BUN 80* 69* 57* 44* 27*  CREATININE 0.86 0.72 0.57* 0.51* 0.47*  CALCIUM 9.0 8.8* 8.6* 8.5* 8.6*  MG  --  1.8  --   --   --    Liver Function Tests:  Recent Labs Lab 07/30/14 0528  AST 13*  ALT 11*  ALKPHOS 82  BILITOT 0.3  PROT 5.7*  ALBUMIN <1.0*   No results for input(s): LIPASE, AMYLASE in the last 168 hours. No results for input(s): AMMONIA in the last 168 hours. CBC:  Recent Labs Lab 07/29/14 0810 07/30/14 0528 07/31/14 0500 08/01/14 0046 08/03/14 0446  WBC 4.7 4.9 4.7 8.1 5.8  HGB 6.9* 7.3* 7.2* 7.8* 6.8*  HCT 22.1* 22.9* 23.4* 24.7* 21.1*  MCV 96.9 95.4 97.5 96.9 97.7  PLT 45* 46* 47* 49* 62*   Cardiac Enzymes: No results for input(s): CKTOTAL, CKMB, CKMBINDEX, TROPONINI in the last 168 hours. BNP (last 3 results) No results for input(s): BNP in the last 8760 hours.  ProBNP (last 3 results) No results for input(s): PROBNP in the  last 8760 hours.  CBG:  Recent Labs Lab 08/02/14 1213 08/02/14 1645 08/02/14 2122 08/03/14 0025 08/03/14 0443  GLUCAP 122* 113* 101* 108* 103*    Recent Results (from the past 240 hour(s))  Culture, blood (routine x 2)     Status: None   Collection Time: 07/26/14  4:35 PM  Result Value Ref Range Status   Specimen Description BLOOD RIGHT ARM  Final   Special Requests BOTTLES DRAWN AEROBIC AND ANAEROBIC 10CC  Final   Culture NO GROWTH 5 DAYS  Final   Report Status 07/31/2014 FINAL  Final  Culture, blood (routine x 2)     Status: None   Collection Time: 07/26/14  4:42 PM  Result Value Ref Range Status   Specimen Description BLOOD RIGHT HAND  Final   Special Requests BOTTLES DRAWN AEROBIC ONLY 10CC  Final   Culture  Setup Time   Final    GRAM POSITIVE COCCI IN CLUSTERS AEROBIC BOTTLE ONLY CRITICAL RESULT CALLED TO, READ BACK BY AND VERIFIED WITH: MHANCOCK,RN BY TCLEVELAND AT 3:08AM 07/28/14 CONFIRMED BY KBARR,MT    Culture   Final    STAPHYLOCOCCUS SPECIES (COAGULASE NEGATIVE) THE SIGNIFICANCE OF ISOLATING THIS  ORGANISM FROM A SINGLE SET OF BLOOD CULTURES WHEN MULTIPLE SETS ARE DRAWN IS UNCERTAIN. PLEASE NOTIFY THE MICROBIOLOGY DEPARTMENT WITHIN ONE WEEK IF SPECIATION AND SENSITIVITIES ARE REQUIRED.    Report Status 08/01/2014 FINAL  Final  Urine culture     Status: None   Collection Time: 07/26/14  5:07 PM  Result Value Ref Range Status   Specimen Description URINE, RANDOM  Final   Special Requests NONE  Final   Culture >=100,000 COLONIES/mL PROTEUS MIRABILIS  Final   Report Status 07/28/2014 FINAL  Final   Organism ID, Bacteria PROTEUS MIRABILIS  Final      Susceptibility   Proteus mirabilis - MIC*    AMPICILLIN <=2 SENSITIVE Sensitive     CEFAZOLIN <=4 SENSITIVE Sensitive     CEFTRIAXONE <=1 SENSITIVE Sensitive     CIPROFLOXACIN >=4 RESISTANT Resistant     GENTAMICIN <=1 SENSITIVE Sensitive     IMIPENEM 1 SENSITIVE Sensitive     NITROFURANTOIN 128 RESISTANT  Resistant     TRIMETH/SULFA <=20 SENSITIVE Sensitive     AMPICILLIN/SULBACTAM <=2 SENSITIVE Sensitive     PIP/TAZO <=4 SENSITIVE Sensitive     * >=100,000 COLONIES/mL PROTEUS MIRABILIS  MRSA PCR Screening     Status: None   Collection Time: 07/26/14  9:02 PM  Result Value Ref Range Status   MRSA by PCR NEGATIVE NEGATIVE Final    Comment:        The GeneXpert MRSA Assay (FDA approved for NASAL specimens only), is one component of a comprehensive MRSA colonization surveillance program. It is not intended to diagnose MRSA infection nor to guide or monitor treatment for MRSA infections.      Studies:  Recent x-ray studies have been reviewed in detail by the Attending Physician  Scheduled Meds:  Scheduled Meds: . alteplase  2 mg Intracatheter Once  . amiodarone  200 mg Per Tube Daily  . antiseptic oral rinse  7 mL Mouth Rinse QID  . aztreonam  1 g Intravenous Q8H  . chlorhexidine  15 mL Mouth Rinse BID  . feeding supplement (PRO-STAT SUGAR FREE 64)  30 mL Per Tube BID  . folic acid  1 mg Per Tube Daily  . free water  200 mL Per Tube 4 times per day  . insulin aspart  0-15 Units Subcutaneous 6 times per day  . insulin aspart  10 Units Intravenous Once  . insulin glargine  12 Units Subcutaneous QHS  . levothyroxine  75 mcg Per Tube QAC breakfast  . pantoprazole sodium  40 mg Per Tube Q1200  . potassium chloride  40 mEq Per Tube TID  . sennosides  10 mL Per Tube BID  . Valproic Acid  500 mg Per Tube 3 times per day    Time spent on care of this patient: 40 mins   Tayla Panozzo, Roselind Messier , MD  [facility] Office  [phone] Pager - [phone]  On-Call/Text Page:      [email or web address]      password [email or web address]  If 7PM-7AM, please contact night-coverage [email or web address] Password [email or web address] 08/03/2014, 8:09 AM   LOS: 8 days   Care during the described time interval was provided by me .  I have reviewed this patient's available data, including medical history, events of note,  physical examination, and all test results as part of my evaluation. I have personally reviewed and interpreted all radiology studies.   Carolyne Littles, MD [phone] Pager

## 2014-08-03 NOTE — Trach Care Team (Signed)
Trach Care Progression Note   Patient Details Name: Jason Becker MRN: [id number] DOB: Aug 31, 1930 Today's Date: 08/03/2014   Tracheostomy Assessment    Tracheostomy Shiley 8 mm Distal (Active)  Status Secured 08/03/2014  1:42 PM  Site Assessment Oozing secretions 08/03/2014  1:42 PM  Site Care Dried;Dressing applied 08/03/2014  1:42 PM  Inner Cannula Care Changed/new 08/03/2014  2:29 AM  Ties Assessment Clean;Dry;Secure 08/03/2014  1:42 PM  Cuff pressure (cm) 35 cm 08/03/2014  8:38 AM  Emergency Equipment at bedside Yes 08/03/2014  1:42 PM     Care Needs     Respiratory Therapy Tracheostomy: Chronic trach O2 Device: Ventilator FiO2 (%): 30 % (decreased due to stable sats) SpO2: 97 % Education:  (None needed at this time) Follow up recommendations: Will follow for progression Respiratory barriers to progression:  (Chronic trach.)    Speech Language Pathology  SLP chart review complete: Patient does not need SLP services at this time   Physical Therapy      Occupational Therapy      Nutritional Patient's Current Diet: Tube feeding Tube Feeding: Vital AF 1.2 Cal Tube Feeding Frequency: Continuous Tube Feeding Strength: Full strength    Case Management/Social Work      Insurance account managerrovider                          Trach Care Team/Provider Recommendations Trach Care Team Members Present-  Lysbeth PennerMary Rymer, RT, Harlon DittyBonnie DeBlois, SLP      Chronic trach from [facility]. None at present.          Marialice Newkirk, Silva BandyDebra Anita (Scribe for team) 08/03/2014, 1:45 PM

## 2014-08-03 NOTE — Progress Notes (Signed)
Nutrition Follow-up  DOCUMENTATION CODES:  Severe malnutrition in context of chronic illness  INTERVENTION:  Prostat, Tube feeding   Continue TF via PEG, Vital AF 1.2 at 55 ml/h and Prostat 30 ml BID to provide 1784 kcals, 129 gm protein, 1069 ml free water daily.  NUTRITION DIAGNOSIS:  Malnutrition related to chronic illness as evidenced by severe depletion of muscle mass, severe depletion of body fat.  Ongoing  GOAL:  Patient will meet greater than or equal to 90% of their needs  Being Met  MONITOR:  TF tolerance, Weight trends, Vent status, Labs, Skin  REASON FOR ASSESSMENT:  Consult Enteral/tube feeding initiation and management  ASSESSMENT: Patient with very advanced dementia and has been 100% vent dependent for approx one year with a complicated course over that time including decubitus pressure ulcers, infections with resistant organisms (UTI, PNA), recurrent anemia. PEG and trach in place.  Pt remains on vent support. Vital AF 1.2 infusing at 55 ml/hr via PEG with 200 ml free water flushes every 4 hours and 30 ml Pro-stat BID; this provides 1784 kcal, 129 grams of protein, and 2269 ml of water daily. Pt is tolerating TF's with 0 ml residual this morning. BM on 6/30.   Labs: low hemoglobin, low calcium  Patient is currently intubated on ventilator support MV: 13.3 L/min Temp (24hrs), Avg:97.9 F (36.6 C), Min:96.9 F (36.1 C), Max:98.9 F (37.2 C)  Height:  Ht Readings from Last 1 Encounters:  07/26/14 $RemoveB'6\' 1"'fqMYDlBb$  (1.854 m)    Weight:  Wt Readings from Last 1 Encounters:  07/27/14 160 lb 4.4 oz (72.7 kg)    Ideal Body Weight:  83.6 kg  Wt Readings from Last 10 Encounters:  07/27/14 160 lb 4.4 oz (72.7 kg)    BMI:  Body mass index is 21.15 kg/(m^2).  Estimated Nutritional Needs:  Kcal:  1816  Protein:  115-135 gm  Fluid:  1.8-2 L  Skin:    stage 4 pressure ulcer to sacrum; stage 3 pressure ulcer to bilateral heels; stage 2 pressure ulcer to  buttocks; stage 2 wound to left lower leg; pressure ulcer to left ankle +2 generalized edema, +2 sacral edema, and +2 RLE edema per nursing notes  Diet Order:   NPO  EDUCATION NEEDS:  No education needs identified at this time   Intake/Output Summary (Last 24 hours) at 08/03/14 1453 Last data filed at 08/03/14 1007  Gross per 24 hour  Intake 2387.92 ml  Output   4700 ml  Net -2312.08 ml    Last BM:  6/30  Pryor Ochoa RD, LDN Inpatient Clinical Dietitian Pager: [phone] After Hours Pager: [phone]

## 2014-08-03 NOTE — Progress Notes (Signed)
Patient seen for Trach Team follow up  No education needed at this time  All supplies at bedside  Will continue to follow

## 2014-08-04 LAB — GLUCOSE, CAPILLARY
GLUCOSE-CAPILLARY: 103 mg/dL — AB (ref 65–99)
GLUCOSE-CAPILLARY: 89 mg/dL (ref 65–99)
GLUCOSE-CAPILLARY: 90 mg/dL (ref 65–99)
Glucose-Capillary: 105 mg/dL — ABNORMAL HIGH (ref 65–99)
Glucose-Capillary: 99 mg/dL (ref 65–99)
Glucose-Capillary: 72 mg/dL (ref 65–99)

## 2014-08-04 NOTE — Progress Notes (Addendum)
[facility] [place] accept [place] on Tuesday, July 5th.  [phone]):  Tuesday July [facility]th 9:00am [facility] CSW confirmed transport with ambulance company  CSW informed Dr of plan for DC to SNF at that time and date- Transport form and fl2 are already signed.  CSW informed pt son who will sign paperwork with facility prior to DC.  CSW will continue to follow.  Merlyn LotJenna Holoman, LCSWA Clinical Social Worker [phone]

## 2014-08-04 NOTE — Care Management (Signed)
Important Message  Patient Details  Name: Londell MohLeonard Dorame MRN: [id number] Date of Birth: 1930/03/16   Medicare Important Message Given:  Yes-second notification given    Hanley HaysDowell, Emilly Lavey T, RN 08/04/2014, 9:04 AM

## 2014-08-04 NOTE — Progress Notes (Signed)
[place] TEAM 1 - Stepdown/ICU TEAM Progress Note  Jason Becker WJX:[id number] DOB: Dec 31, 1930 DOA: 07/26/2014 PCP: Hillary Bow, MD  Admit HPI / Brief Narrative: 41 M who lives in [facility] with advanced dementia who has been 100% vent dependent for approx one year with a complicated course over that time including at least one cardiac arrest, decubitus pressure ulcers, infections with resistant organisms (UTI, PNA), and recurrent anemia. He was initially intubated for aspiration PNA and underwent trach placement for recurrence. Prior to his initial hospitalization one year ago, his dementia had advanced to the point where he required assistance with feeding and his daughter indicated that his intellect/functional status at that point was equivalent to that of a [age] yr old. Routine weekly labs on the day of admission noted a Hgb of 4.8 and Cr 1.8 prompting transfer to [facility] ED where he was initially treated as a code sepsis. A unit of PRBCs was transfused en route to the ED and two more units ordered in the ED.  HPI/Subjective: Patient remains noncommunicative.  There is no family present at time of my visit.  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) Per PCCM "at this point there is no realistic hope for liberation from the vent" - no attempts to be made to wean  Chronic anemia w/ severe acute anemia  Per PCCM "continued transfusion would be futile" - Hgb waxing and waning -  No evidence of acute blood loss - follow and only transfuse if clearly continuing to decline  Severe sepsis / septic shock - Proteus UTI Should be appropriately cover with current antibiotics - complete a 10 day tx course and follow   H/O cardiac arrest - AICD present  Parox Afib  Normal sinus rhythm presently   Hypovolemia / Hypotension  Per PCCM "pressors off, no restart, would be futile, will not offer" - blood pressure stable  Acute kidney injury on CKD Not a HD candidate per PCCM - renal function has  normalized with GFR >60/crt 0.47  Severe hypernatremia Free water deficit - resolved with free water administration  Multiple decubitus ulcers Continue best efforts to treat/avoid progression  ?contaminated blood cx - gram+ cocci clusters 1/2 Speciation confirms coag-negative staph suggestive of contaminant - discontinued vancomycin  H/O MRSA, pseudomonas, ESBL E coli  Hypothyroidism Continue Synthroid therapy  Hyperglycemia Follow with sliding scale insulin - controlled at present  Severe hypokalemia   Corrected to normal   Very advanced dementia  Very severe, irreversible debilitation  Code Status: FULL Family Communication: no family present at time of exam Disposition Plan: SDU   Consultants: PCCM Palliative Care Ethics Committee   Antibiotics: Vanc 6/22 > 6/26 Aztreonam 6/22 > 7/1  DVT prophylaxis: SCDs  Objective: Blood pressure 102/44, pulse 74, temperature 98.9 F (37.2 C), temperature source Axillary, resp. rate 34, height  (1.854 m), weight 72.7 kg (160 lb 4.4 oz), SpO2 100 %.  Intake/Output Summary (Last 24 hours) at 08/04/14 1941 Last data filed at 08/04/14 1800  Gross per 24 hour  Intake   3230 ml  Output   4125 ml  Net   -895 ml   Exam: General: No acute respiratory distress Lungs: No focal crackles or wheezes B  Cardiovascular: Regular rate and rhythm  Ext:  Persistent 1+ upper and lower extremity edema which is now symmetric  Data Reviewed: Basic Metabolic Panel:  Recent Labs Lab 07/29/14 0810 07/30/14 0528 07/31/14 0500 08/01/14 0046 08/03/14 0446  NA 142 141 141 139 135  K  2.8* 3.1* 4.0 4.8 4.7  CL 116* 115* 116* 114* 105  CO2 22 22 21* 22 25  GLUCOSE 203* 187* 173* 91 118*  BUN 80* 69* 57* 44* 27*  CREATININE 0.86 0.72 0.57* 0.51* 0.47*  CALCIUM 9.0 8.8* 8.6* 8.5* 8.6*  MG  --  1.8  --   --   --     CBC:  Recent Labs Lab 07/29/14 0810 07/30/14 0528 07/31/14 0500 08/01/14 0046 08/03/14 0446  WBC 4.7 4.9  4.7 8.1 5.8  HGB 6.9* 7.3* 7.2* 7.8* 6.8*  HCT 22.1* 22.9* 23.4* 24.7* 21.1*  MCV 96.9 95.4 97.5 96.9 97.7  PLT 45* 46* 47* 49* 62*    Liver Function Tests:  Recent Labs Lab 07/30/14 0528  AST 13*  ALT 11*  ALKPHOS 82  BILITOT 0.3  PROT 5.7*  ALBUMIN <1.0*   CBG:  Recent Labs Lab 08/04/14 0019 08/04/14 0444 08/04/14 0825 08/04/14 1201 08/04/14 1706  GLUCAP 103* 90 105* 99 89    Recent Results (from the past 240 hour(s))  Culture, blood (routine x 2)     Status: None   Collection Time: 07/26/14  4:35 PM  Result Value Ref Range Status   Specimen Description BLOOD RIGHT ARM  Final   Special Requests BOTTLES DRAWN AEROBIC AND ANAEROBIC 10CC  Final   Culture NO GROWTH 5 DAYS  Final   Report Status 07/31/2014 FINAL  Final  Culture, blood (routine x 2)     Status: None   Collection Time: 07/26/14  4:42 PM  Result Value Ref Range Status   Specimen Description BLOOD RIGHT HAND  Final   Special Requests BOTTLES DRAWN AEROBIC ONLY 10CC  Final   Culture  Setup Time   Final    GRAM POSITIVE COCCI IN CLUSTERS AEROBIC BOTTLE ONLY CRITICAL RESULT CALLED TO, READ BACK BY AND VERIFIED WITH: MHANCOCK,RN BY TCLEVELAND AT 3:08AM 07/28/14 CONFIRMED BY KBARR,MT    Culture   Final    STAPHYLOCOCCUS SPECIES (COAGULASE NEGATIVE) THE SIGNIFICANCE OF ISOLATING THIS ORGANISM FROM A SINGLE SET OF BLOOD CULTURES WHEN MULTIPLE SETS ARE DRAWN IS UNCERTAIN. PLEASE NOTIFY THE MICROBIOLOGY DEPARTMENT WITHIN ONE WEEK IF SPECIATION AND SENSITIVITIES ARE REQUIRED.    Report Status 08/01/2014 FINAL  Final  Urine culture     Status: None   Collection Time: 07/26/14  5:07 PM  Result Value Ref Range Status   Specimen Description URINE, RANDOM  Final   Special Requests NONE  Final   Culture >=100,000 COLONIES/mL PROTEUS MIRABILIS  Final   Report Status 07/28/2014 FINAL  Final   Organism ID, Bacteria PROTEUS MIRABILIS  Final      Susceptibility   Proteus mirabilis - MIC*    AMPICILLIN <=2  SENSITIVE Sensitive     CEFAZOLIN <=4 SENSITIVE Sensitive     CEFTRIAXONE <=1 SENSITIVE Sensitive     CIPROFLOXACIN >=4 RESISTANT Resistant     GENTAMICIN <=1 SENSITIVE Sensitive     IMIPENEM 1 SENSITIVE Sensitive     NITROFURANTOIN 128 RESISTANT Resistant     TRIMETH/SULFA <=20 SENSITIVE Sensitive     AMPICILLIN/SULBACTAM <=2 SENSITIVE Sensitive     PIP/TAZO <=4 SENSITIVE Sensitive     * >=100,000 COLONIES/mL PROTEUS MIRABILIS  MRSA PCR Screening     Status: None   Collection Time: 07/26/14  9:02 PM  Result Value Ref Range Status   MRSA by PCR NEGATIVE NEGATIVE Final    Comment:        The GeneXpert MRSA Assay (FDA approved  for NASAL specimens only), is one component of a comprehensive MRSA colonization surveillance program. It is not intended to diagnose MRSA infection nor to guide or monitor treatment for MRSA infections.      Studies:   Recent x-ray studies have been reviewed in detail by the Attending Physician  Scheduled Meds:  Scheduled Meds: . alteplase  2 mg Intracatheter Once  . amiodarone  200 mg Per Tube Daily  . antiseptic oral rinse  7 mL Mouth Rinse QID  . chlorhexidine  15 mL Mouth Rinse BID  . feeding supplement (PRO-STAT SUGAR FREE 64)  30 mL Per Tube BID  . folic acid  1 mg Per Tube Daily  . free water  200 mL Per Tube 4 times per day  . insulin aspart  0-15 Units Subcutaneous 6 times per day  . insulin aspart  10 Units Intravenous Once  . insulin glargine  12 Units Subcutaneous QHS  . levothyroxine  75 mcg Per Tube QAC breakfast  . pantoprazole sodium  40 mg Per Tube Q1200  . potassium chloride  40 mEq Per Tube TID  . sennosides  10 mL Per Tube BID  . Valproic Acid  500 mg Per Tube 3 times per day    Time spent on care of this patient: 25 mins   [facility] CenterMCCLUNG,Adalyna Godbee T , MD   [facility] Office  [phone] Pager - Text Page per Loretha StaplerAmion as per below:  On-Call/Text Page:      [email or web address]      password [email or web address]  If 7PM-7AM, please contact  night-coverage [email or web address] Password [email or web address] 08/04/2014, 7:41 PM   LOS: 9 days

## 2014-08-05 LAB — GLUCOSE, CAPILLARY
Glucose-Capillary: 94 mg/dL (ref 65–99)
Glucose-Capillary: 101 mg/dL — ABNORMAL HIGH (ref 65–99)
Glucose-Capillary: 92 mg/dL (ref 65–99)
Glucose-Capillary: 90 mg/dL (ref 65–99)
Glucose-Capillary: 94 mg/dL (ref 65–99)
Glucose-Capillary: 78 mg/dL (ref 65–99)
Glucose-Capillary: 75 mg/dL (ref 65–99)

## 2014-08-05 LAB — BASIC METABOLIC PANEL
Anion gap: 6 (ref 5–15)
BUN: 26 mg/dL — ABNORMAL HIGH (ref 6–20)
CALCIUM: 8.9 mg/dL (ref 8.9–10.3)
CO2: 27 mmol/L (ref 22–32)
Chloride: 101 mmol/L (ref 101–111)
Creatinine, Ser: 0.53 mg/dL — ABNORMAL LOW (ref 0.61–1.24)
GFR calc Af Amer: >60 mL/min (ref >60)
GFR calc non Af Amer: >60 mL/min (ref >60)
GLUCOSE: 91 mg/dL (ref 65–99)
POTASSIUM: 4.7 mmol/L (ref 3.5–5.1)
Sodium: 134 mmol/L — ABNORMAL LOW (ref 135–145)

## 2014-08-05 LAB — CBC
HCT: 23.4 % — ABNORMAL LOW (ref 39.0–52.0)
Hemoglobin: 7.3 g/dL — ABNORMAL LOW (ref 13.0–17.0)
MCH: 30.5 pg (ref 26.0–34.0)
MCHC: 31.2 g/dL (ref 30.0–36.0)
MCV: 97.9 fL (ref 78.0–100.0)
PLATELETS: 81 10*3/uL — AB (ref 150–400)
RBC: 2.39 MIL/uL — AB (ref 4.22–5.81)
RDW: 19.3 % — AB (ref 11.5–15.5)
WBC: 8.2 10*3/uL (ref 4.0–10.5)

## 2014-08-05 MED ORDER — SODIUM CHLORIDE 0.9 % IV SOLN
INTRAVENOUS | Status: DC
  Administered 2014-08-05: 10 mL/h via INTRAVENOUS

## 2014-08-05 MED ORDER — POTASSIUM CHLORIDE 20 MEQ/15ML (10%) PO SOLN
40.0000 meq | Freq: Two times a day (BID) | ORAL | Status: DC
  Administered 2014-08-05: 40 meq

## 2014-08-05 NOTE — Progress Notes (Signed)
[facility] TEAM 1 - Stepdown/ICU TEAM Progress Note  Nevan Creighton ZOX:[id number] DOB: [birth year] DOA: 07/26/2014 PCP: Hillary Bow, MD  Admit HPI / Brief Narrative: 59 M who lives in [facility] with advanced dementia who has been 100% vent dependent for approx one year with a complicated course over that time including at least one cardiac arrest, decubitus pressure ulcers, infections with resistant organisms (UTI, PNA), and recurrent anemia. He was initially intubated for aspiration PNA and underwent trach placement for recurrence. Prior to his initial hospitalization one year ago, his dementia had advanced to the point where he required assistance with feeding and his daughter indicated that his intellect/functional status at that point was equivalent to that of a [age] yr old. Routine weekly labs on the day of admission noted a Hgb of 4.8 and Cr 1.8 prompting transfer to [facility] ED where he was initially treated as a code sepsis. A unit of PRBCs was transfused en route to the ED and two more units ordered in the ED.  HPI/Subjective: Patient is noncommunicative.   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) Per PCCM "at this point there is no realistic hope for liberation from the vent" - no attempts to be made to wean  Chronic anemia w/ severe acute anemia  Per PCCM "continued transfusion would be futile" - Hgb waxing and waning - no evidence of acute blood loss - follow and only transfuse if clearly continuing to decline - Hgb hovers in the 6.8 to 7.3 range in general  Severe sepsis / septic shock - Proteus UTI completed a 10 day tx course    H/O cardiac arrest - AICD present  Parox Afib  Normal sinus rhythm presently   Hypovolemia / Hypotension  Per PCCM "pressors off, no restart, would be futile, will not offer" - blood pressure remains stable  Acute kidney injury on CKD Not a HD candidate per PCCM - renal function has normalized with GFR >60/crt 0.47  Severe hypernatremia Free  water deficit - resolved with free water administration  Multiple decubitus ulcers Continue best efforts to treat/avoid progression  ?contaminated blood cx - gram+ cocci clusters 1/2 Speciation confirms coag-negative staph suggestive of contaminant - discontinued vancomycin  H/O MRSA, pseudomonas, ESBL E coli  Hypothyroidism Continue Synthroid therapy  Hyperglycemia Follow with sliding scale insulin - controlled   Severe hypokalemia   Corrected to normal   Very advanced dementia  Very severe, irreversible debilitation  Code Status: FULL Family Communication: no family present at time of exam Disposition Plan: SDU - for transfer on Tuesday  Consultants: PCCM Palliative Care Ethics Committee   Antibiotics: Vanc 6/22 > 6/26 Aztreonam 6/22 > 7/1  DVT prophylaxis: SCDs  Objective: Blood pressure 100/47, pulse 69, temperature 98.1 F (36.7 C), temperature source Axillary, resp. rate 23, height  (1.854 m), weight 72.7 kg (160 lb 4.4 oz), SpO2 97 %.  Intake/Output Summary (Last 24 hours) at 08/05/14 1134 Last data filed at 08/05/14 1000  Gross per 24 hour  Intake 2723.33 ml  Output   3950 ml  Net -1226.67 ml   Exam: General: No acute respiratory distress - noncommunicative  Lungs: No focal crackles or wheeze Cardiovascular: Regular rate and rhythm  Ext:  Persistent 1+ upper and lower extremity edema which is symmetric  Data Reviewed: Basic Metabolic Panel:  Recent Labs Lab 07/30/14 0528 07/31/14 0500 08/01/14 0046 08/03/14 0446 08/05/14 0445  NA 141 141 139 135 134*  K 3.1* 4.0 4.8 4.7 4.7  CL 115* 116*  114* 105 101  CO2 22 21* 22 25 27   GLUCOSE 187* 173* 91 118* 91  BUN 69* 57* 44* 27* 26*  CREATININE 0.72 0.57* 0.51* 0.47* 0.53*  CALCIUM 8.8* 8.6* 8.5* 8.6* 8.9  MG 1.8  --   --   --   --     CBC:  Recent Labs Lab 07/30/14 0528 07/31/14 0500 08/01/14 0046 08/03/14 0446 08/05/14 0445  WBC 4.9 4.7 8.1 5.8 8.2  HGB 7.3* 7.2* 7.8* 6.8*  7.3*  HCT 22.9* 23.4* 24.7* 21.1* 23.4*  MCV 95.4 97.5 96.9 97.7 97.9  PLT 46* 47* 49* 62* 81*    Liver Function Tests:  Recent Labs Lab 07/30/14 0528  AST 13*  ALT 11*  ALKPHOS 82  BILITOT 0.3  PROT 5.7*  ALBUMIN <1.0*   CBG:  Recent Labs Lab 08/04/14 1957 08/04/14 2320 08/05/14 0148 08/05/14 0353 08/05/14 0805  GLUCAP 72 94 101* 92 90    Recent Results (from the past 240 hour(s))  Culture, blood (routine x 2)     Status: None   Collection Time: 07/26/14  4:35 PM  Result Value Ref Range Status   Specimen Description BLOOD RIGHT ARM  Final   Special Requests BOTTLES DRAWN AEROBIC AND ANAEROBIC 10CC  Final   Culture NO GROWTH 5 DAYS  Final   Report Status 07/31/2014 FINAL  Final  Culture, blood (routine x 2)     Status: None   Collection Time: 07/26/14  4:42 PM  Result Value Ref Range Status   Specimen Description BLOOD RIGHT HAND  Final   Special Requests BOTTLES DRAWN AEROBIC ONLY 10CC  Final   Culture  Setup Time   Final    GRAM POSITIVE COCCI IN CLUSTERS AEROBIC BOTTLE ONLY CRITICAL RESULT CALLED TO, READ BACK BY AND VERIFIED WITH: MHANCOCK,RN BY TCLEVELAND AT 3:08AM 07/28/14 CONFIRMED BY KBARR,MT    Culture   Final    STAPHYLOCOCCUS SPECIES (COAGULASE NEGATIVE) THE SIGNIFICANCE OF ISOLATING THIS ORGANISM FROM A SINGLE SET OF BLOOD CULTURES WHEN MULTIPLE SETS ARE DRAWN IS UNCERTAIN. PLEASE NOTIFY THE MICROBIOLOGY DEPARTMENT WITHIN ONE WEEK IF SPECIATION AND SENSITIVITIES ARE REQUIRED.    Report Status 08/01/2014 FINAL  Final  Urine culture     Status: None   Collection Time: 07/26/14  5:07 PM  Result Value Ref Range Status   Specimen Description URINE, RANDOM  Final   Special Requests NONE  Final   Culture >=100,000 COLONIES/mL PROTEUS MIRABILIS  Final   Report Status 07/28/2014 FINAL  Final   Organism ID, Bacteria PROTEUS MIRABILIS  Final      Susceptibility   Proteus mirabilis - MIC*    AMPICILLIN <=2 SENSITIVE Sensitive     CEFAZOLIN <=4  SENSITIVE Sensitive     CEFTRIAXONE <=1 SENSITIVE Sensitive     CIPROFLOXACIN >=4 RESISTANT Resistant     GENTAMICIN <=1 SENSITIVE Sensitive     IMIPENEM 1 SENSITIVE Sensitive     NITROFURANTOIN 128 RESISTANT Resistant     TRIMETH/SULFA <=20 SENSITIVE Sensitive     AMPICILLIN/SULBACTAM <=2 SENSITIVE Sensitive     PIP/TAZO <=4 SENSITIVE Sensitive     * >=100,000 COLONIES/mL PROTEUS MIRABILIS  MRSA PCR Screening     Status: None   Collection Time: 07/26/14  9:02 PM  Result Value Ref Range Status   MRSA by PCR NEGATIVE NEGATIVE Final    Comment:        The GeneXpert MRSA Assay (FDA approved for NASAL specimens only), is one component of a  comprehensive MRSA colonization surveillance program. It is not intended to diagnose MRSA infection nor to guide or monitor treatment for MRSA infections.      Studies:   Recent x-ray studies have been reviewed in detail by the Attending Physician  Scheduled Meds:  Scheduled Meds: . alteplase  2 mg Intracatheter Once  . amiodarone  200 mg Per Tube Daily  . antiseptic oral rinse  7 mL Mouth Rinse QID  . chlorhexidine  15 mL Mouth Rinse BID  . feeding supplement (PRO-STAT SUGAR FREE 64)  30 mL Per Tube BID  . folic acid  1 mg Per Tube Daily  . free water  200 mL Per Tube 4 times per day  . insulin aspart  0-15 Units Subcutaneous 6 times per day  . insulin aspart  10 Units Intravenous Once  . insulin glargine  12 Units Subcutaneous QHS  . levothyroxine  75 mcg Per Tube QAC breakfast  . pantoprazole sodium  40 mg Per Tube Q1200  . potassium chloride  40 mEq Per Tube TID  . sennosides  10 mL Per Tube BID  . Valproic Acid  500 mg Per Tube 3 times per day    Time spent on care of this patient: 25 mins   [facility] InstituteMCCLUNG,Argie Applegate T , MD   [facility] Office  [phone] Pager - Text Page per Loretha StaplerAmion as per below:  On-Call/Text Page:      [email or web address]      password [email or web address]  If 7PM-7AM, please contact night-coverage [email or web address] Password  [email or web address] 08/05/2014, 11:34 AM   LOS: 10 days

## 2014-08-06 LAB — BASIC METABOLIC PANEL
Anion gap: 3 — ABNORMAL LOW (ref 5–15)
BUN: 25 mg/dL — ABNORMAL HIGH (ref 6–20)
CO2: 27 mmol/L (ref 22–32)
Calcium: 9 mg/dL (ref 8.9–10.3)
Chloride: 105 mmol/L (ref 101–111)
Creatinine, Ser: 0.46 mg/dL — ABNORMAL LOW (ref 0.61–1.24)
GFR calc Af Amer: >60 mL/min (ref >60)
GFR calc non Af Amer: >60 mL/min (ref >60)
GLUCOSE: 94 mg/dL (ref 65–99)
POTASSIUM: 5.6 mmol/L — AB (ref 3.5–5.1)
Sodium: 135 mmol/L (ref 135–145)

## 2014-08-06 LAB — CBC
HCT: 22.6 % — ABNORMAL LOW (ref 39.0–52.0)
HEMOGLOBIN: 7 g/dL — AB (ref 13.0–17.0)
MCH: 30.4 pg (ref 26.0–34.0)
MCHC: 31 g/dL (ref 30.0–36.0)
MCV: 98.3 fL (ref 78.0–100.0)
Platelets: 83 10*3/uL — ABNORMAL LOW (ref 150–400)
RBC: 2.3 MIL/uL — AB (ref 4.22–5.81)
RDW: 19.5 % — ABNORMAL HIGH (ref 11.5–15.5)
WBC: 9.1 10*3/uL (ref 4.0–10.5)

## 2014-08-06 LAB — GLUCOSE, CAPILLARY
GLUCOSE-CAPILLARY: 83 mg/dL (ref 65–99)
GLUCOSE-CAPILLARY: 83 mg/dL (ref 65–99)
GLUCOSE-CAPILLARY: 79 mg/dL (ref 65–99)
GLUCOSE-CAPILLARY: 78 mg/dL (ref 65–99)
Glucose-Capillary: 98 mg/dL (ref 65–99)
Glucose-Capillary: 88 mg/dL (ref 65–99)

## 2014-08-06 NOTE — Progress Notes (Signed)
[place] TEAM 1 - Stepdown/ICU TEAM Progress Note  Londell MohLeonard Musto RUE:[id number] DOB: 1930-02-06 DOA: 07/26/2014 PCP: Hillary BowROWLEY, MCKAY, MD  Admit HPI / Brief Narrative: [age] M who lives in [facility] with advanced dementia who has been 100% vent dependent for approx one year with a complicated course over that time including at least one cardiac arrest, decubitus pressure ulcers, infections with resistant organisms (UTI, PNA), and recurrent anemia. He was initially intubated for aspiration PNA and underwent trach placement for recurrence. Prior to his initial hospitalization one year ago, his dementia had advanced to the point where he required assistance with feeding and his daughter indicated that his intellect/functional status at that point was equivalent to that of a [age] yr old. Routine weekly labs on the day of admission noted a Hgb of 4.8 and Cr 1.8 prompting [facility] where he was initially treated as a code sepsis. A unit of PRBCs was transfused en route to the ED and two more units ordered in the ED.  HPI/Subjective: Patient is noncommunicative.   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) Per PCCM "at this point there is no realistic hope for liberation from the vent" - no attempts to be made to wean  Chronic anemia w/ severe acute anemia  Per PCCM "continued transfusion would be futile" - Hgb waxing and waning - no evidence of acute blood loss - follow and only transfuse if clearly continuing to decline - Hgb hovers in the 6.8 to 7.3 range in general  Severe sepsis / septic shock - Proteus UTI completed a 10 day tx course    H/O cardiac arrest - AICD present  Parox Afib  Normal sinus rhythm presently   Hypovolemia / Hypotension  Per PCCM "pressors off, no restart, would be futile, will not offer" - blood pressure remains stable  Acute kidney injury on CKD Not a HD candidate per PCCM - renal function has normalized with GFR >60/crt 0.47  Severe hypernatremia Free  water deficit - resolved with free water administration  Multiple decubitus ulcers Continue best efforts to treat/avoid progression  ?contaminated blood cx - gram+ cocci clusters 1/2 Speciation confirms coag-negative staph suggestive of contaminant - discontinued vancomycin  H/O MRSA, pseudomonas, ESBL E coli  Hypothyroidism Continue Synthroid therapy  Hyperglycemia Follow with sliding scale insulin - controlled   Severe hypokalemia   Corrected to normal   Very advanced dementia  Very severe, irreversible debilitation  Code Status: FULL Family Communication: no family present at time of exam Disposition Plan: SDU - for transfer on Tuesday  Consultants: PCCM Palliative Care Ethics Committee   Antibiotics: Vanc 6/22 > 6/26 Aztreonam 6/22 > 7/1  DVT prophylaxis: SCDs  Objective: Blood pressure 114/47, pulse 83, temperature 98.3 F (36.8 C), temperature source Oral, resp. rate 28, height 6\' 1"  (1.854 m), weight 72.7 kg (160 lb 4.4 oz), SpO2 100 %.  Intake/Output Summary (Last 24 hours) at 08/06/14 1346 Last data filed at 08/06/14 1200  Gross per 24 hour  Intake 2421.09 ml  Output   4200 ml  Net -1778.91 ml   Exam: General: No acute respiratory distress  Lungs: No focal crackles or wheeze Cardiovascular: Regular rate and rhythm  Ext:  upper and lower extremity edema which is symmetric and improved in general  Data Reviewed: Basic Metabolic Panel:  Recent Labs Lab 07/31/14 0500 08/01/14 0046 08/03/14 0446 08/05/14 0445 08/06/14 0006  NA 141 139 135 134* 135  K 4.0 4.8 4.7 4.7 5.6*  CL 116* 114*  105 101 105  CO2 21* GLUCOSE 173* 91 118* 91 94  BUN 57* 44* 27* 26* 25*  CREATININE 0.57* 0.51* 0.47* 0.53* 0.46*  CALCIUM 8.6* 8.5* 8.6* 8.9 9.0    CBC:  Recent Labs Lab 07/31/14 0500 08/01/14 0046 08/03/14 0446 08/05/14 0445 08/06/14 0006  WBC 4.7 8.1 5.8 8.2 9.1  HGB 7.2* 7.8* 6.8* 7.3* 7.0*  HCT 23.4* 24.7* 21.1* 23.4* 22.6*    MCV 97.5 96.9 97.7 97.9 98.3  PLT 47* 49* 62* 81* 83*    Liver Function Tests: No results for input(s): AST, ALT, ALKPHOS, BILITOT, PROT, ALBUMIN in the last 168 hours. CBG:  Recent Labs Lab 08/05/14 1958 08/05/14 2307 08/06/14 0421 08/06/14 0838 08/06/14 1246  GLUCAP 75 98 83 83 88    No results found for this or any previous visit (from the past 240 hour(s)).   Studies:   Recent x-ray studies have been reviewed in detail by the Attending Physician  Scheduled Meds:  Scheduled Meds: . alteplase  2 mg Intracatheter Once  . amiodarone  200 mg Per Tube Daily  . antiseptic oral rinse  7 mL Mouth Rinse QID  . chlorhexidine  15 mL Mouth Rinse BID  . feeding supplement (PRO-STAT SUGAR FREE 64)  30 mL Per Tube BID  . folic acid  1 mg Per Tube Daily  . free water  200 mL Per Tube 4 times per day  . insulin aspart  0-15 Units Subcutaneous 6 times per day  . insulin aspart  10 Units Intravenous Once  . insulin glargine  12 Units Subcutaneous QHS  . levothyroxine  75 mcg Per Tube QAC breakfast  . pantoprazole sodium  40 mg Per Tube Q1200  . sennosides  10 mL Per Tube BID  . Valproic Acid  500 mg Per Tube 3 times per day    Time spent on care of this patient: 15 mins   Besnik Febus T , MD   [facility] Office  [phone] Pager - Text Page per Loretha Stapler as per below:  On-Call/Text Page:      [email or web address]      password [email or web address]  If 7PM-7AM, please contact night-coverage [email or web address] Password [email or web address] 08/06/2014, 1:46 PM   LOS: 11 days

## 2014-08-07 LAB — CBC
HEMATOCRIT: 22.3 % — AB (ref 39.0–52.0)
HEMOGLOBIN: 7.2 g/dL — AB (ref 13.0–17.0)
MCH: 31.9 pg (ref 26.0–34.0)
MCHC: 32.3 g/dL (ref 30.0–36.0)
MCV: 98.7 fL (ref 78.0–100.0)
Platelets: 86 10*3/uL — ABNORMAL LOW (ref 150–400)
RBC: 2.26 MIL/uL — ABNORMAL LOW (ref 4.22–5.81)
RDW: 19.4 % — AB (ref 11.5–15.5)
WBC: 7.1 10*3/uL (ref 4.0–10.5)

## 2014-08-07 LAB — COMPREHENSIVE METABOLIC PANEL
ALK PHOS: 80 U/L (ref 38–126)
ALT: 10 U/L — AB (ref 17–63)
AST: 17 U/L (ref 15–41)
Albumin: 1.1 g/dL — ABNORMAL LOW (ref 3.5–5.0)
Anion gap: 7 (ref 5–15)
BILIRUBIN TOTAL: 0.4 mg/dL (ref 0.3–1.2)
BUN: 27 mg/dL — AB (ref 6–20)
CO2: 27 mmol/L (ref 22–32)
CREATININE: 0.51 mg/dL — AB (ref 0.61–1.24)
Calcium: 8.9 mg/dL (ref 8.9–10.3)
Chloride: 98 mmol/L — ABNORMAL LOW (ref 101–111)
GFR calc Af Amer: >60 mL/min (ref >60)
GFR calc non Af Amer: >60 mL/min (ref >60)
GLUCOSE: 88 mg/dL (ref 65–99)
POTASSIUM: 3.9 mmol/L (ref 3.5–5.1)
Sodium: 132 mmol/L — ABNORMAL LOW (ref 135–145)
TOTAL PROTEIN: 6.2 g/dL — AB (ref 6.5–8.1)

## 2014-08-07 LAB — GLUCOSE, CAPILLARY
GLUCOSE-CAPILLARY: 94 mg/dL (ref 65–99)
GLUCOSE-CAPILLARY: 91 mg/dL (ref 65–99)
GLUCOSE-CAPILLARY: 85 mg/dL (ref 65–99)
Glucose-Capillary: 112 mg/dL — ABNORMAL HIGH (ref 65–99)
Glucose-Capillary: 82 mg/dL (ref 65–99)
Glucose-Capillary: 90 mg/dL (ref 65–99)

## 2014-08-07 MED ORDER — LEVOTHYROXINE SODIUM 75 MCG PO TABS: 75.0000 ug | ORAL_TABLET | Freq: Every day | ORAL | Status: AC

## 2014-08-07 MED ORDER — AMIODARONE HCL 200 MG PO TABS: 200.0000 mg | ORAL_TABLET | Freq: Every day | ORAL | Status: AC

## 2014-08-07 MED ORDER — FREE WATER: 200.0000 mL | Freq: Four times a day (QID) | Status: AC

## 2014-08-07 MED ORDER — FOLIC ACID 1 MG PO TABS: 1.0000 mg | ORAL_TABLET | Freq: Every day | ORAL | Status: AC

## 2014-08-07 MED ORDER — INSULIN GLARGINE 100 UNIT/ML ~~LOC~~ SOLN: 12.0000 [IU] | Freq: Every day | SUBCUTANEOUS | Status: AC

## 2014-08-07 MED ORDER — VALPROIC ACID 250 MG/5ML PO SYRP: 500.0000 mg | ORAL_SOLUTION | Freq: Three times a day (TID) | ORAL | Status: AC

## 2014-08-07 MED ORDER — SENNOSIDES 8.8 MG/5ML PO SYRP: 10.0000 mL | ORAL_SOLUTION | Freq: Two times a day (BID) | ORAL | Status: AC

## 2014-08-07 MED ORDER — VITAL AF 1.2 CAL PO LIQD: 1000.0000 mL | ORAL | Status: AC

## 2014-08-07 MED ORDER — ACETAMINOPHEN 325 MG PO TABS: 650.0000 mg | ORAL_TABLET | ORAL | Status: AC | PRN

## 2014-08-07 MED ORDER — LORAZEPAM 2 MG/ML PO CONC: 1.0000 mg | ORAL | Status: AC | PRN

## 2014-08-07 MED ORDER — PANTOPRAZOLE SODIUM 40 MG PO PACK: 40.0000 mg | PACK | Freq: Every day | ORAL | Status: AC

## 2014-08-07 MED ORDER — PRO-STAT SUGAR FREE PO LIQD: 30.0000 mL | Freq: Two times a day (BID) | ORAL | Status: AC

## 2014-08-07 MED ORDER — CETYLPYRIDINIUM CHLORIDE 0.05 % MT LIQD: 7.0000 mL | Freq: Four times a day (QID) | OROMUCOSAL | Status: AC

## 2014-08-07 MED ORDER — MORPHINE SULFATE (CONCENTRATE) 10 MG /0.5 ML PO SOLN: 10.0000 mg | ORAL | Status: AC | PRN

## 2014-08-07 MED ORDER — CHLORHEXIDINE GLUCONATE 0.12 % MT SOLN: 15.0000 mL | Freq: Two times a day (BID) | OROMUCOSAL | Status: AC

## 2014-08-07 NOTE — Discharge Summary (Addendum)
DISCHARGE SUMMARY  Jason Becker  MR#: [id number]  DOB:09/12/1930  Date of Admission: 07/26/2014 Date of Discharge: 08/08/2014  Attending Physician:Jessamyn Watterson J  Patient's WUJ:[id number], MCKAY, MD  Consults: PCCM Palliative Care Ethics Committee  Disposition: D/C to SNF in [place], [place] 08/08/2014     Follow-up Information    Follow up with SNF Attending of Record.   Why:  The Attending of Record at your chosen SNF will provide for your ongoing care.      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) Chronic anemia w/ severe acute anemia  Severe sepsis / septic shock - Proteus UTI H/O cardiac arrest - AICD present Parox Afib  Hypovolemia / Hypotension  Acute kidney injury on CKD Severe hypernatremia Multiple decubitus ulcers ?contaminated blood cx - gram+ cocci clusters 1/2 H/O MRSA, pseudomonas, ESBL E coli Hypothyroidism Hyperglycemia Severe hypokalemia  Very advanced dementia Very severe, irreversible debilitation  Wound Care: Pack wounds with moist fluffed gauze and cover with gauze and tape Q day to Sacrum, buttock, left leg, left heel, right heel  Vent Settings: PRVC mode - Vt 500cc - rate 20 - PEEP 5 - titrate O2 prn to keep sats 92-97%  Initial presentation: 34 M who previously lived in [facility] with advanced dementia who has been 100% vent dependent for approx one year with a complicated course over that time including at least one cardiac arrest with severe anoxic injury, decubitus pressure ulcers, infections with resistant organisms (UTI, PNA), and recurrent anemia. He was initially intubated in the past for aspiration PNA and underwent trach placement for recurrence. Prior to his initial hospitalization one year ago, his dementia had advanced to the point where he required assistance with feeding and his daughter indicated that his intellect/functional status at that point was equivalent to that of a 79yr old. Routine weekly labs at his SNF on the day  of admission noted a Hgb of 4.8 and Cr 1.8 prompting transfer to [facility] ED where he was initially treated as a code sepsis. A unit of PRBCs was transfused en route to the ED and two more units ordered in the ED.  Hospital Course: The patient was admitted to the acute units and initially attended to by the [facility] service.  He received antibiotic treatment for Proteus UTI with sepsis, blood transfusions for significant anemia, and general volume resuscitation for sepsis combined with severe dehydration.  As the patient stabilized he was transferred to the [facility] service for the remainder of his hospital stay.  During his hospitalization discussion was held between the care team and the patient's power of attorney regarding goals of care with the medical team feeling that ongoing aggressive care was futile.  The patient's power of attorney disagreed and insisted that active medical care continued to be provided.  Palliative Care and the Ethics Team were consulted, despite which the care plan was not changed.  As the patient began to reach a point of stability discharge planning was initiated.  The patient was offered a bed at [facility] but his power of attorney did not wish for him to return there.  At the request of his POA multiple LTAC facilities in the surrounding region were contacted, none of which would accept the patient.  Ultimately a ventilator capable SNF was located in [facility].  The POA desired for the patient to be transferred to this facility in the facility except the patient.  Listed below are the active issues addressed during this hospital stay:  Chronic  VDRF (1 year) Per PCCM "at this point there is no realistic hope for liberation from the vent" - no attempts were made to wean  Chronic anemia w/ severe acute anemia  Hgb waxed and waned - no evidence of acute blood loss - only transfused if clearly continuing to decline - Hgb seems hovers in the 6.8 to 7.3  range in general  Severe sepsis / septic shock - Proteus UTI completed a 10 day tx course- shock resolved   H/O cardiac arrest - AICD present  Parox Afib  Normal sinus rhythm th/o his stay    Hypovolemia / Hypotension  Per PCCM "pressors off, no restart, would be futile, will not offer" - blood pressure remained stable  Acute kidney injury on CKD Not a HD candidate per PCCM - renal function has normalized with GFR >60/crt 0.47  Severe hypernatremia Free water deficit - resolved with free water administration per feeding tube and IV   Multiple decubitus ulcers Continue best efforts to treat/avoid progression  ?contaminated blood cx - gram+ cocci clusters 1/2 Speciation confirms coag-negative staph suggestive of contaminant - discontinued vancomycin  H/O MRSA, pseudomonas, ESBL E coli  Hypothyroidism Continue Synthroid therapy  Hyperglycemia Follow with sliding scale insulin - controlled   Severe hypokalemia  Corrected to normal   Very advanced dementia  Very severe, irreversible debilitation     Medication List    STOP taking these medications        bacitracin ointment     ciprofloxacin 400 MG/40ML Soln injection  Commonly known as:  CIPRO     cloNIDine 0.1 MG tablet  Commonly known as:  CATAPRES     dextrose 5 % solution     diltiazem 60 MG tablet  Commonly known as:  CARDIZEM     docusate 50 MG/5ML liquid  Commonly known as:  COLACE     HYDROcodone-acetaminophen 5-325 MG per tablet  Commonly known as:  NORCO/VICODIN     ipratropium-albuterol 0.5-2.5 (3) MG/3ML Soln  Commonly known as:  DUONEB     multivitamin Liqd     omeprazole 40 MG capsule  Commonly known as:  PRILOSEC     ondansetron 4 MG tablet  Commonly known as:  ZOFRAN     sennosides-docusate sodium 8.6-50 MG tablet  Commonly known as:  SENOKOT-S      TAKE these medications        acetaminophen 325 MG tablet  Commonly known as:  TYLENOL  Place 2 tablets (650 mg total)  into feeding tube every 4 (four) hours as needed for mild pain (temp > 101.5).     amiodarone 200 MG tablet  Commonly known as:  PACERONE  Place 1 tablet (200 mg total) into feeding tube daily.     antiseptic oral rinse 0.05 % Liqd solution  Commonly known as:  CPC / CETYLPYRIDINIUM CHLORIDE 0.05%  7 mLs by Mouth Rinse route QID.     chlorhexidine 0.12 % solution  Commonly known as:  PERIDEX  15 mLs by Mouth Rinse route 2 (two) times daily.     feeding supplement (PRO-STAT SUGAR FREE 64) Liqd  Place 30 mLs into feeding tube 2 (two) times daily.     feeding supplement (VITAL AF 1.2 CAL) Liqd  Place 1,000 mLs into feeding tube continuous.     folic acid 1 MG tablet  Commonly known as:  FOLVITE  Place 1 tablet (1 mg total) into feeding tube daily.     free water Soln  Place  200 mLs into feeding tube every 6 (six) hours.     insulin glargine 100 UNIT/ML injection  Commonly known as:  LANTUS  Inject 0.12 mLs (12 Units total) into the skin at bedtime.     levothyroxine 75 MCG tablet  Commonly known as:  SYNTHROID, LEVOTHROID  Place 1 tablet (75 mcg total) into feeding tube daily before breakfast.     LORazepam 2 MG/ML concentrated solution  Commonly known as:  ATIVAN  Place 0.5 mLs (1 mg total) into feeding tube every 4 (four) hours as needed for anxiety.     morphine CONCENTRATE 10 mg / 0.5 ml concentrated solution  Place 0.5-1 mLs (10-20 mg total) into feeding tube every 3 (three) hours as needed for severe pain.     pantoprazole sodium 40 mg/20 mL Pack  Commonly known as:  PROTONIX  Place 20 mLs (40 mg total) into feeding tube daily at 12 noon.     sennosides 8.8 MG/5ML syrup  Commonly known as:  SENOKOT  Place 10 mLs into feeding tube 2 (two) times daily.     Valproic Acid 250 MG/5ML Syrp syrup  Commonly known as:  DEPAKENE  Place 10 mLs (500 mg total) into feeding tube every 8 (eight) hours.        Day of Discharge BP 108/52 mmHg  Pulse 94  Temp(Src) 100.3  F (37.9 C) (Oral)  Resp 27  Ht 6\' 1"  (1.854 m)  Wt 72.7 kg (160 lb 4.4 oz)  BMI 21.15 kg/m2  SpO2 95%  Physical Exam: General: No acute respiratory distress Lungs: No wheezes or crackles - trach in place on vent w/o complications  Cardiovascular: Regular rate and rhythm  Abdomen: Near midline PEG tube insertion site clean without surrounding erythema or discharge, nondistended, soft, no appreciable ascites, bowel sounds hypoactive but present Extremities: No significant cyanosis, or clubbing;  1+ diffuse edema bilateral lower and upper extremities Neuro:  The patient does not communicate nor does he follow commands - at times he is seen to close his eyes - he does not follow the examiner with his eyes   Basic Metabolic Panel:  Recent Labs Lab 08/03/14 0446 08/05/14 0445 08/06/14 0006 08/07/14 0400  NA 135 134* 135 132*  K 4.7 4.7 5.6* 3.9  CL 105 101 105 98*  CO2 25 27 27 27   GLUCOSE 118* 91 94 88  BUN 27* 26* 25* 27*  CREATININE 0.47* 0.53* 0.46* 0.51*  CALCIUM 8.6* 8.9 9.0 8.9    Liver Function Tests:  Recent Labs Lab 08/07/14 0400  AST 17  ALT 10*  ALKPHOS 80  BILITOT 0.4  PROT 6.2*  ALBUMIN 1.1*   CBC:  Recent Labs Lab 08/03/14 0446 08/05/14 0445 08/06/14 0006 08/07/14 0400  WBC 5.8 8.2 9.1 7.1  HGB 6.8* 7.3* 7.0* 7.2*  HCT 21.1* 23.4* 22.6* 22.3*  MCV 97.7 97.9 98.3 98.7  PLT 62* 81* 83* 86*    CBG:  Recent Labs Lab 08/07/14 1658 08/07/14 2003 08/08/14 0110 08/08/14 0418 08/08/14 0758  GLUCAP 85 82 88 84 110*    Time spent in discharge (includes decision making & examination of pt): >35 minutes  08/08/2014, 11:43 AM   Lonia BloodJeffrey T. McClung, MD [facility] Office  [phone] Pager [phone]  On-Call/Text Page:      [email or web address]      password [email or web address]

## 2014-08-08 DIAGNOSIS — I469 Cardiac arrest, cause unspecified: Secondary | ICD-10-CM | POA: Diagnosis present

## 2014-08-08 DIAGNOSIS — R6521 Severe sepsis with septic shock: Secondary | ICD-10-CM

## 2014-08-08 DIAGNOSIS — A419 Sepsis, unspecified organism: Secondary | ICD-10-CM | POA: Diagnosis present

## 2014-08-08 DIAGNOSIS — N189 Chronic kidney disease, unspecified: Secondary | ICD-10-CM

## 2014-08-08 DIAGNOSIS — N179 Acute kidney failure, unspecified: Secondary | ICD-10-CM | POA: Diagnosis present

## 2014-08-08 LAB — GLUCOSE, CAPILLARY
GLUCOSE-CAPILLARY: 88 mg/dL (ref 65–99)
Glucose-Capillary: 84 mg/dL (ref 65–99)
Glucose-Capillary: 110 mg/dL — ABNORMAL HIGH (ref 65–99)

## 2014-08-08 NOTE — Care Management (Signed)
Important Message  Patient Details  Name: Jason Becker MRN: [id number] Date of Birth: April 09, 1930   Medicare Important Message Given:  Yes-fourth notification given    Hanley HaysDowell, Cassondra Stachowski T, RN 08/08/2014, 9:05 AM

## 2014-08-08 NOTE — Progress Notes (Addendum)
[facility]- [facility]  Transport [facility] 9am [facility]  Packet on chart with signed physician transfer form- RN informed of time of transport  [facility] [place] Anticipated discharge date:08/08/14 Family notified: pt son [facility]- will arrive at 9am for patient transfer  CSW signing off.  Merlyn LotJenna Holoman, LCSWA Clinical Social Worker [phone]

## 2014-11-04 DEATH — deceased

## 2016-09-20 IMAGING — CR DG CHEST 1V PORT
2 series · 2 of 2 positions shown · non-contrast
Comparison: None.

CLINICAL DATA: Tracheostomy tube patient. Acute decreasing
hemoglobin. Atrial fibrillation. Hypertension. Acute on chronic
respiratory failure. Coronary artery disease.

EXAM:
PORTABLE CHEST - 1 VIEW

[AP (1 of 2)]
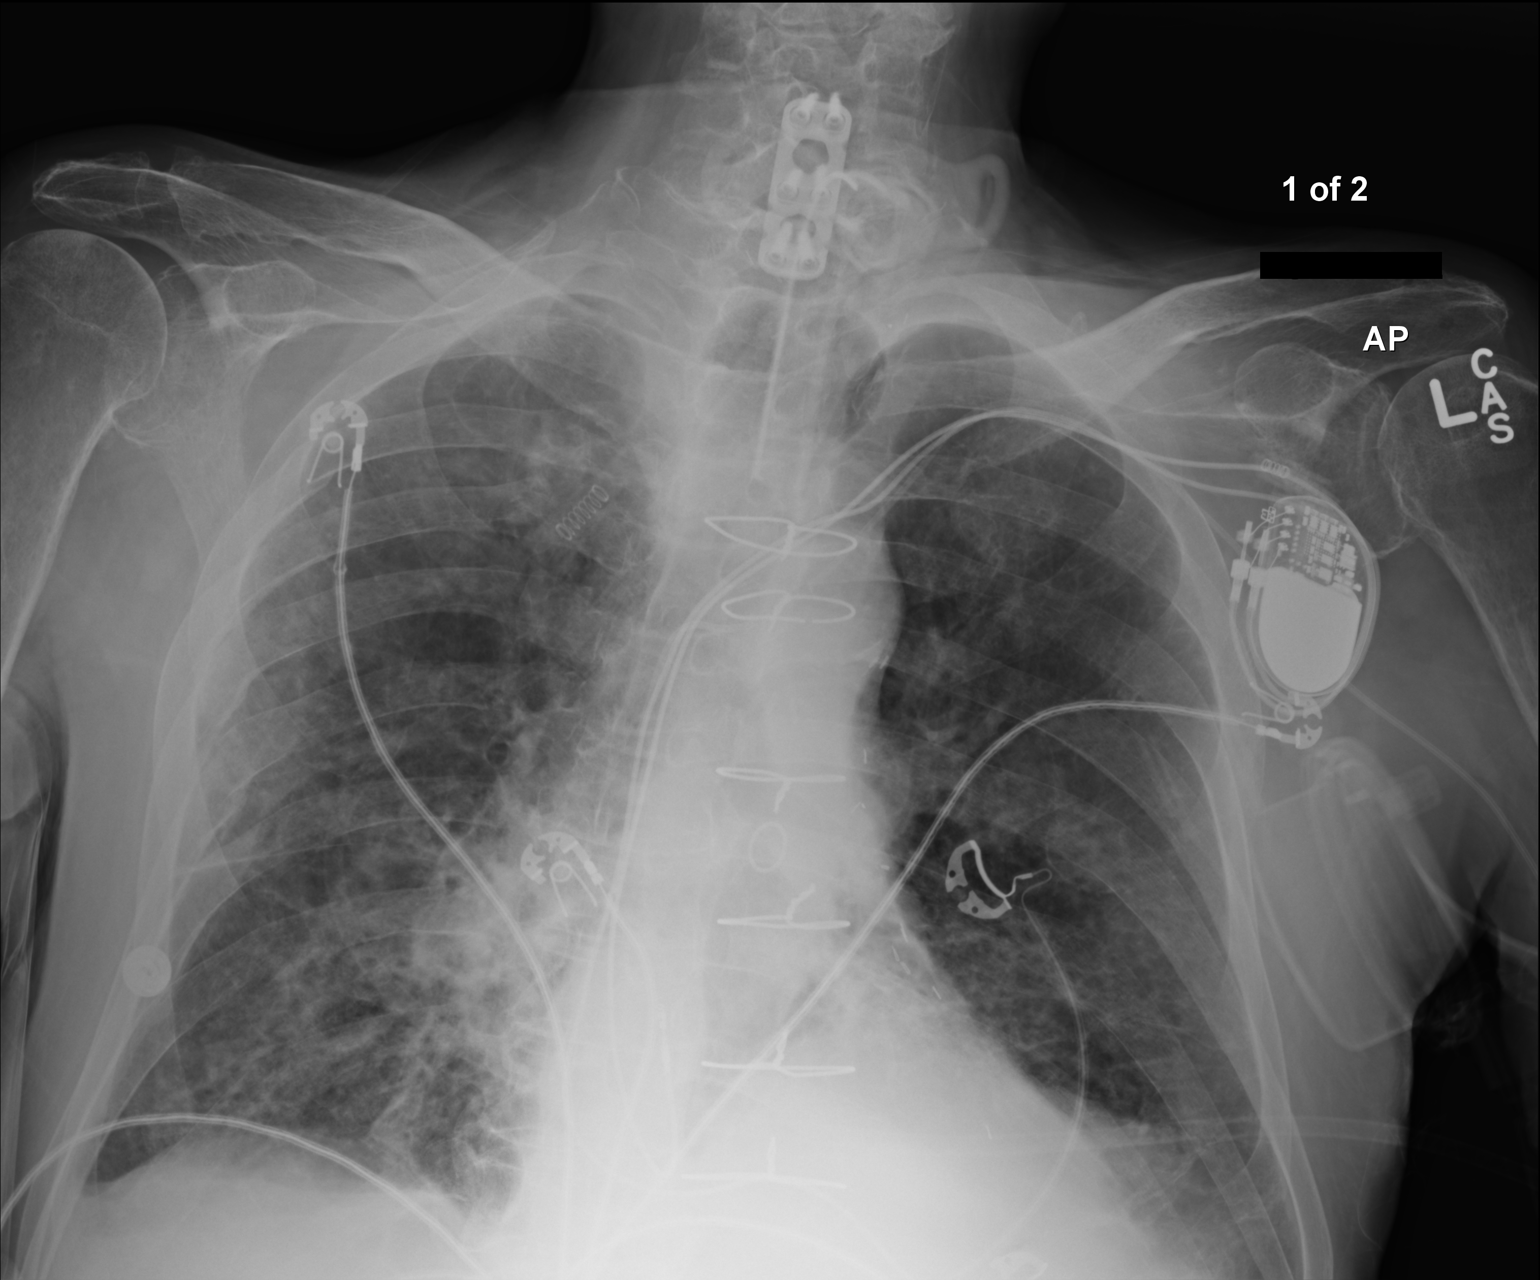

[AP (2 of 2)]
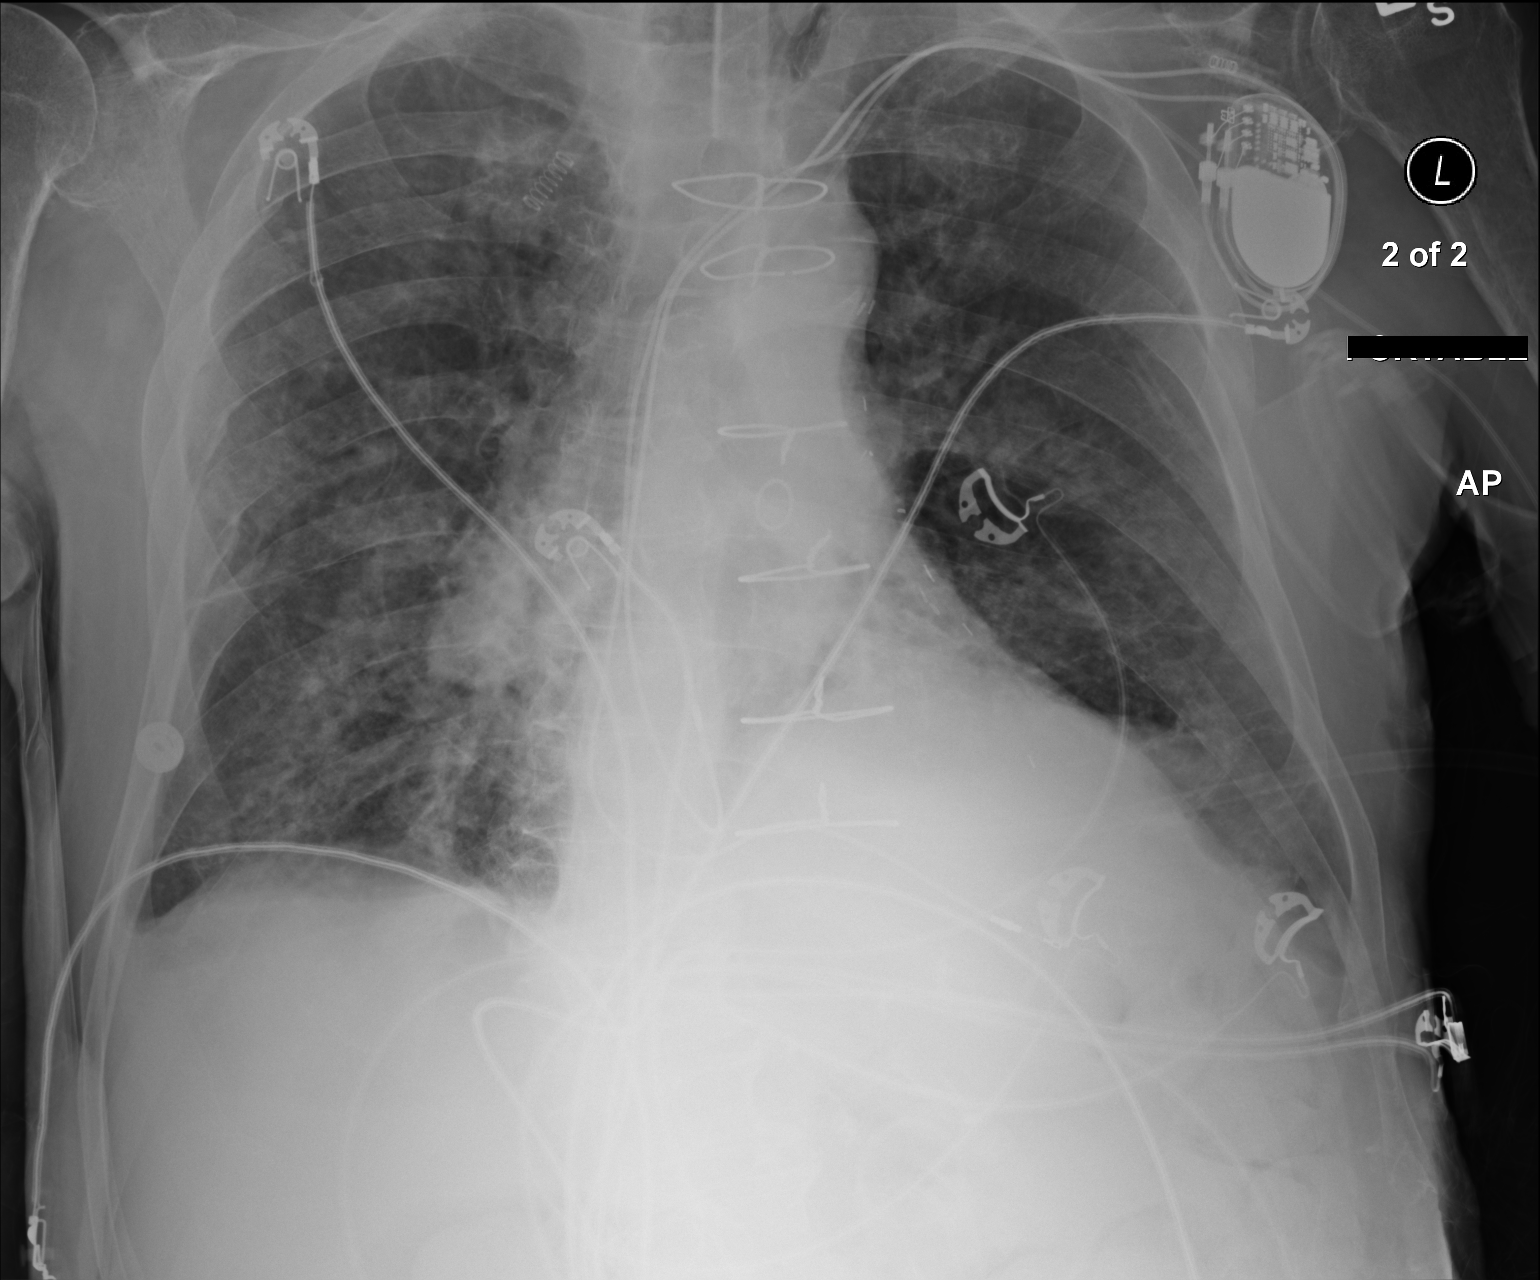

[2 of 2 positions shown; findings below may reference images not displayed]

FINDINGS: Tracheostomy tube is seen in appropriate position as well as
transvenous pacemaker. Prior CABG noted.

Heart size is within normal limits. Diffuse pulmonary interstitial
prominence is seen, suspicious for mild interstitial edema, although
this could also be chronic in etiology. Opacity in the left
retrocardiac lung base may be due to atelectasis or consolidation.

Asymmetric right hilar soft tissue fullness noted, and right hilar
lymphadenopathy cannot be excluded. Several old right rib fracture
deformities incidentally noted.
IMPRESSION: Left retrocardiac atelectasis versus consolidation.

Diffuse pulmonary interstitial prominence suspicious for mild
interstitial edema, although this could also be chronic in etiology.

Right hilar soft tissue fullness. Right hilar mass or
lymphadenopathy cannot be excluded. Recommend chest CT with contrast
for further evaluation.

## 2016-09-21 IMAGING — CR DG CHEST 1V PORT
1 series · 1 of 1 positions shown · non-contrast
Comparison: July 26, 2014

CLINICAL DATA: Respiratory failure/hypoxia

EXAM:
PORTABLE CHEST - 1 VIEW

[AP]
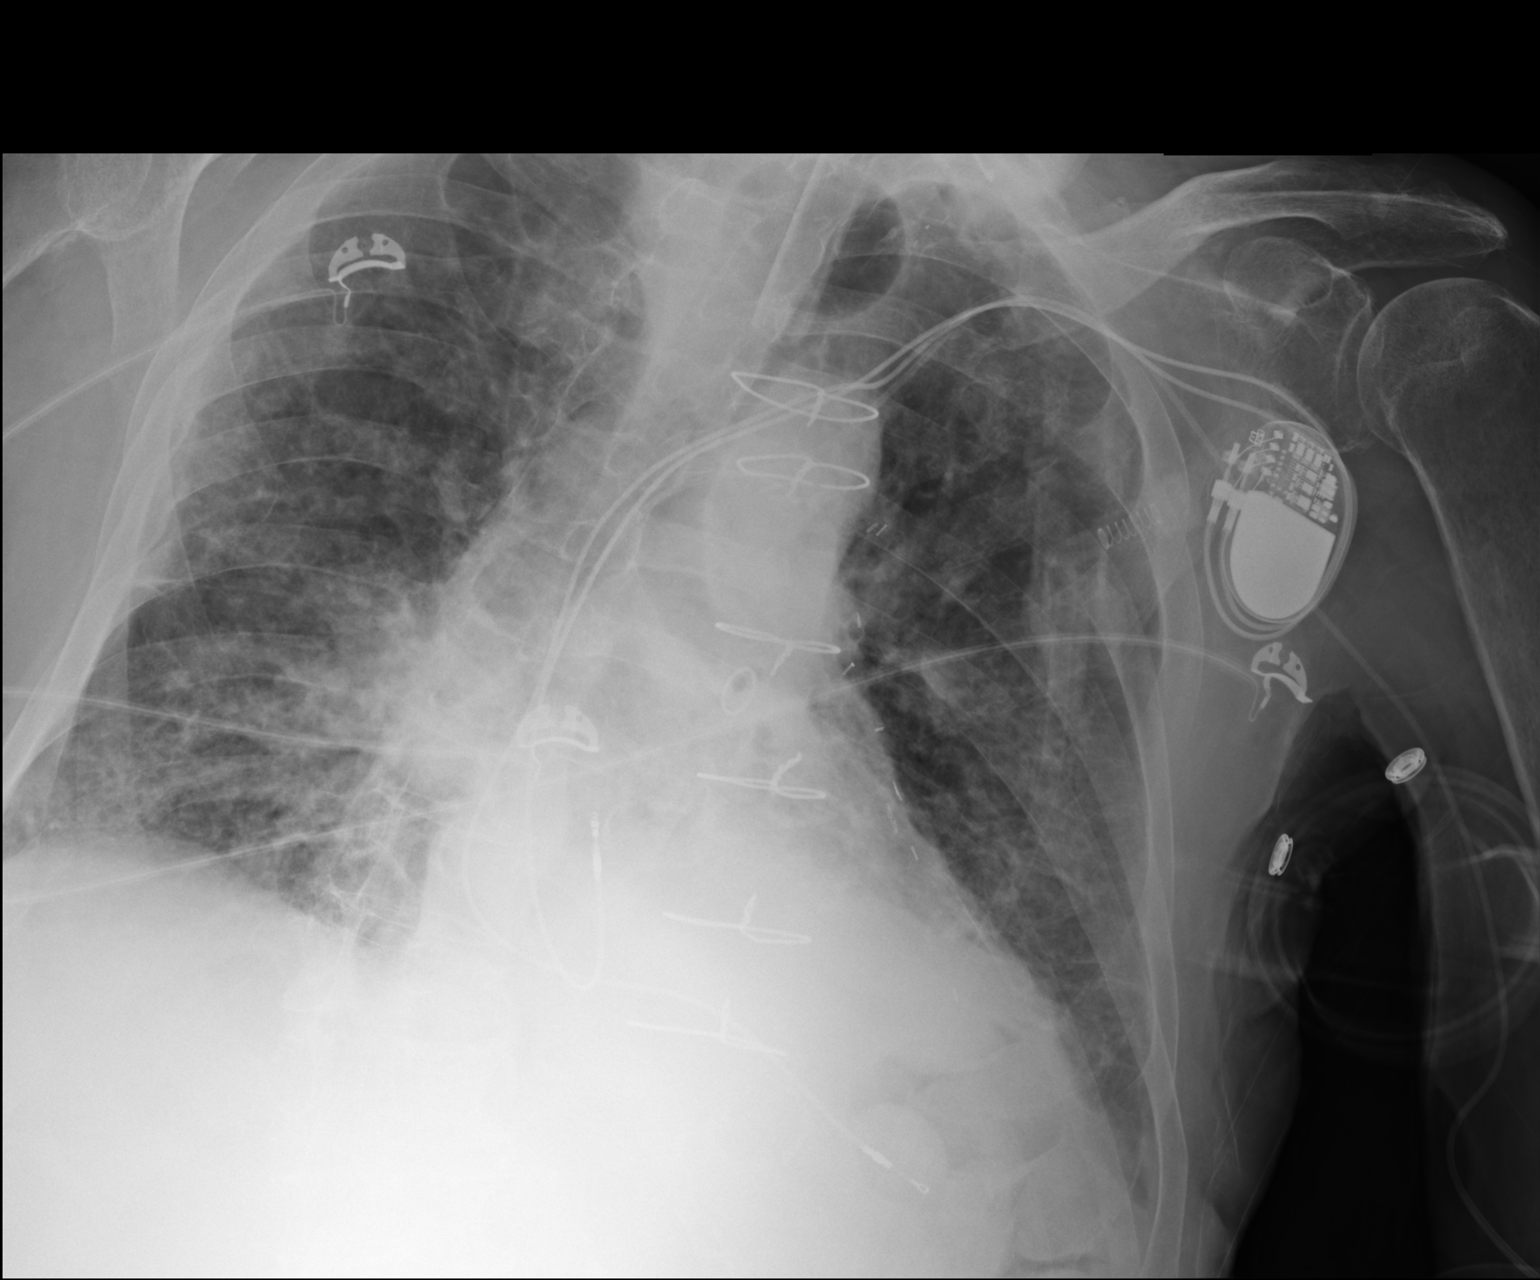

[1 of 1 positions shown; findings below may reference images not displayed]

FINDINGS: Tracheostomy is present with the tip 5.6 cm above the carina. No
pneumothorax.

There is airspace consolidation in the left lower lobe, stable. The
interstitium is diffusely prominent with reticular interstitial
thickening throughout the mid and lower lung zones. The heart is
upper normal in size with pulmonary vascularity within normal
limits. Pacemaker leads are attached to the right atrium right
ventricle. Fullness in the right hilar region remains stable. There
is no new opacity. There is postoperative change in the lower
cervical spine.
IMPRESSION: No appreciable change from 1 day prior. Suspect chronic interstitial
lung disease. There may be superimposed interstitial pulmonary
edema. A degree of congestive heart failure cannot be excluded.
Airspace consolidation in the left lower lobe is suspicious for
pneumonia. There is no apparent pneumothorax.

Fullness in the right hilar region remains. Adenopathy in this area
must be of concern. Contrast enhanced chest CT advised to further
assess in this regard.
